# Patient Record
Sex: Male | Born: 1945 | Hispanic: No | Smoking: Former smoker
Health system: Southern US, Community
[De-identification: ages and names within clinical notes are randomized; demographics above are authoritative.]

## PROBLEM LIST (undated history)

## (undated) DIAGNOSIS — I495 Sick sinus syndrome: Secondary | ICD-10-CM

## (undated) DIAGNOSIS — G4733 Obstructive sleep apnea (adult) (pediatric): Secondary | ICD-10-CM

## (undated) DIAGNOSIS — I10 Essential (primary) hypertension: Secondary | ICD-10-CM

## (undated) DIAGNOSIS — C801 Malignant (primary) neoplasm, unspecified: Secondary | ICD-10-CM

## (undated) DIAGNOSIS — I519 Heart disease, unspecified: Secondary | ICD-10-CM

## (undated) DIAGNOSIS — U071 COVID-19: Secondary | ICD-10-CM

## (undated) DIAGNOSIS — K769 Liver disease, unspecified: Secondary | ICD-10-CM

## (undated) DIAGNOSIS — J449 Chronic obstructive pulmonary disease, unspecified: Secondary | ICD-10-CM

## (undated) DIAGNOSIS — N200 Calculus of kidney: Secondary | ICD-10-CM

## (undated) DIAGNOSIS — M2550 Pain in unspecified joint: Secondary | ICD-10-CM

## (undated) HISTORY — DX: Obstructive sleep apnea (adult) (pediatric): G47.33

## (undated) HISTORY — DX: Liver disease, unspecified: K76.9

## (undated) HISTORY — DX: Heart disease, unspecified: I51.9

## (undated) HISTORY — DX: Calculus of kidney: N20.0

## (undated) HISTORY — DX: Pain in unspecified joint: M25.50

## (undated) HISTORY — DX: Sick sinus syndrome: I49.5

## (undated) HISTORY — DX: Chronic obstructive pulmonary disease, unspecified: J44.9

## (undated) HISTORY — DX: Essential (primary) hypertension: I10

## (undated) HISTORY — PX: TRANSURETHRAL RESECTION OF PROSTATE: SHX73

---

## 2009-07-18 ENCOUNTER — Encounter: Admission: RE | Admit: 2009-07-18 | Discharge: 2009-07-18 | Payer: Self-pay | Admitting: General Surgery

## 2009-07-22 ENCOUNTER — Ambulatory Visit (HOSPITAL_COMMUNITY): Admission: RE | Admit: 2009-07-22 | Discharge: 2009-07-22 | Payer: Self-pay | Admitting: Gastroenterology

## 2009-11-17 ENCOUNTER — Encounter: Admission: RE | Admit: 2009-11-17 | Discharge: 2009-11-17 | Payer: Self-pay | Admitting: General Surgery

## 2009-11-25 ENCOUNTER — Emergency Department (HOSPITAL_BASED_OUTPATIENT_CLINIC_OR_DEPARTMENT_OTHER): Admission: EM | Admit: 2009-11-25 | Discharge: 2009-11-26 | Payer: Self-pay | Admitting: Emergency Medicine

## 2009-11-26 ENCOUNTER — Ambulatory Visit: Payer: Self-pay | Admitting: Diagnostic Radiology

## 2010-06-18 LAB — URINALYSIS, ROUTINE W REFLEX MICROSCOPIC
Bilirubin Urine: NEGATIVE
Glucose, UA: NEGATIVE mg/dL
Ketones, ur: NEGATIVE mg/dL
Nitrite: POSITIVE — AB
Urobilinogen, UA: 0.2 mg/dL (ref 0.0–1.0)

## 2010-06-18 LAB — URINE MICROSCOPIC-ADD ON

## 2010-06-18 LAB — CBC
Hemoglobin: 14.4 g/dL (ref 13.0–17.0)
MCHC: 33.9 g/dL (ref 30.0–36.0)
MCV: 91.5 fL (ref 78.0–100.0)
Platelets: 170 10*3/uL (ref 150–400)
RDW: 11.8 % (ref 11.5–15.5)
WBC: 4.9 10*3/uL (ref 4.0–10.5)

## 2010-06-18 LAB — DIFFERENTIAL
Eosinophils Absolute: 0.3 10*3/uL (ref 0.0–0.7)
Eosinophils Relative: 7 % — ABNORMAL HIGH (ref 0–5)
Lymphocytes Relative: 40 % (ref 12–46)
Lymphs Abs: 1.9 10*3/uL (ref 0.7–4.0)
Monocytes Absolute: 0.5 10*3/uL (ref 0.1–1.0)
Neutro Abs: 2.1 10*3/uL (ref 1.7–7.7)

## 2010-06-18 LAB — COMPREHENSIVE METABOLIC PANEL
ALT: 35 U/L (ref 0–53)
Albumin: 3.8 g/dL (ref 3.5–5.2)
BUN: 9 mg/dL (ref 6–23)
Creatinine, Ser: 0.7 mg/dL (ref 0.4–1.5)
GFR calc Af Amer: >60 mL/min (ref >60)
Potassium: 3.8 mEq/L (ref 3.5–5.1)
Sodium: 140 mEq/L (ref 135–145)

## 2010-06-18 LAB — GLUCOSE, CAPILLARY: Glucose-Capillary: 151 mg/dL — ABNORMAL HIGH (ref 70–99)

## 2010-06-18 LAB — POCT CARDIAC MARKERS
CKMB, poc: <1 ng/mL — ABNORMAL LOW (ref 1.0–8.0)
Myoglobin, poc: 54.7 ng/mL (ref 12–200)

## 2010-10-28 ENCOUNTER — Ambulatory Visit (INDEPENDENT_AMBULATORY_CARE_PROVIDER_SITE_OTHER): Payer: Medicaid Other | Admitting: General Surgery

## 2010-10-28 ENCOUNTER — Encounter (INDEPENDENT_AMBULATORY_CARE_PROVIDER_SITE_OTHER): Payer: Self-pay | Admitting: General Surgery

## 2010-10-28 VITALS — Temp 97.8°F

## 2010-10-28 DIAGNOSIS — R1900 Intra-abdominal and pelvic swelling, mass and lump, unspecified site: Secondary | ICD-10-CM | POA: Insufficient documentation

## 2010-10-28 LAB — COMPREHENSIVE METABOLIC PANEL
ALT: 15 U/L (ref 0–53)
AST: 20 U/L (ref 0–37)
Albumin: 4.2 g/dL (ref 3.5–5.2)
Calcium: 9.3 mg/dL (ref 8.4–10.5)
Creat: 0.71 mg/dL (ref 0.50–1.35)
Potassium: 3.9 mEq/L (ref 3.5–5.3)
Total Bilirubin: 0.5 mg/dL (ref 0.3–1.2)
Total Protein: 6.9 g/dL (ref 6.0–8.3)

## 2010-10-28 LAB — CBC WITH DIFFERENTIAL/PLATELET
HCT: 41.7 % (ref 39.0–52.0)
Lymphs Abs: 1.8 10*3/uL (ref 0.7–4.0)
MCHC: 32.6 g/dL (ref 30.0–36.0)
Monocytes Absolute: 0.4 10*3/uL (ref 0.1–1.0)
Monocytes Relative: 8 % (ref 3–12)
Neutro Abs: 1.8 10*3/uL (ref 1.7–7.7)
Neutrophils Relative %: 43 % (ref 43–77)
WBC: 4.2 10*3/uL (ref 4.0–10.5)

## 2010-10-28 NOTE — Assessment & Plan Note (Signed)
Get repeat CMET, CBC, tumor markers. Get repeat MRI to evaluate for change.   Pt was supposed to get MRI 9 months ago, but did not follow up.   Will schedule.

## 2010-10-28 NOTE — Progress Notes (Signed)
Theodore Hess is a 65 y.o. male.    Chief Complaint  Patient presents with  . 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  Last year he was occasionally having epigastric pain, but that is resolved.  He has had no weight loss, no nausea or vomiting.  He denies fevers/ chills.  He has not been having any other medical problems recently other than prostate issues.    Past Medical History  Diagnosis Date  . Hypertension   . Heart disease   . Liver disease   . Kidney stone   . Hearing loss   . Joint pain   . Heart disease   . Liver disease   . Kidney stone   . Sinus pain   . Pain in elbow joint     Past Surgical History  Procedure Date  . Prostate biopsy     History reviewed. No pertinent family history.  Social History History  Substance Use Topics  . Smoking status: Never Smoker   . Smokeless tobacco: Not on file  . Alcohol Use: 0.6 oz/week    1 Glasses of wine per week    No Known Allergies  Current Outpatient Prescriptions  Medication Sig Dispense Refill  . aspirin 81 MG tablet Take 81 mg by mouth daily.        . Budesonide-Formoterol Fumarate (SYMBICORT IN) Inhale into the lungs.        Marland Kitchen desonide (DESOWEN) 0.05 % cream Apply 1 application topically 2 (two) times daily.        . Diclofenac Sodium (SOLARAZE) 3 % GEL Place onto the skin.        Marland Kitchen dutasteride (AVODART) 0.5 MG capsule Take 0.5 mg by mouth daily.        Marland Kitchen atenolol (TENORMIN) 100 MG tablet Take by mouth daily.        Marland Kitchen desonide (DESONATE) 0.05 % gel Apply topically 2 (two) times daily.        Marland Kitchen loratadine (CLARITIN) 10 MG tablet Take 10 mg by mouth daily.        . promethazine (PHENERGAN) 12.5 MG tablet Take 12.5 mg by mouth every 6 (six) hours as needed.        . Silodosin (RAPAFLO PO) Take by mouth.          Review of Systems Review of Systems  Constitutional: Negative.   HENT: Positive for congestion.   Eyes: Negative.     Respiratory: Positive for cough and shortness of breath.   Cardiovascular: Negative.   Gastrointestinal: Negative.   Genitourinary:       Difficulty urinating for large prostate  Musculoskeletal: Positive for joint pain.  Skin: Negative.   Neurological: Negative.   Endo/Heme/Allergies: Negative.   Psychiatric/Behavioral: Negative.     Physical Exam Physical Exam  Constitutional: He is oriented to person, place, and time. He appears well-developed and well-nourished. No distress.  HENT:  Head: Normocephalic and atraumatic.  Mouth/Throat: No oropharyngeal exudate.  Eyes: Conjunctivae are normal. Pupils are equal, round, and reactive to light. No scleral icterus.  Neck: Normal range of motion. Neck supple. No tracheal deviation present. No thyromegaly present.  Cardiovascular: Normal rate and regular rhythm.   Respiratory: Effort normal. No respiratory distress. He exhibits no tenderness.  GI: Soft. Bowel sounds are normal. He exhibits no distension and no mass. There is no  tenderness. There is no rebound and no guarding.  Musculoskeletal: Normal range of motion. He exhibits no edema and no tenderness.  Lymphadenopathy:    He has no cervical adenopathy.  Neurological: He is alert and oriented to person, place, and time. Coordination normal.  Skin: Skin is warm and dry. No rash noted. He is not diaphoretic. No erythema. No pallor.  Psychiatric: He has a normal mood and affect. His behavior is normal. Judgment and thought content normal.     Temperature 97.8 F (36.6 C).  Assessment/Plan  Mass of abdomen, porta hepatis Get repeat CMET, CBC, tumor markers. Get repeat MRI to evaluate for change.   Pt was supposed to get MRI 9 months ago, but did not follow up.   Will schedule.      Earlie Arciga 10/28/2010, 3:33 PM

## 2010-11-06 ENCOUNTER — Ambulatory Visit
Admission: RE | Admit: 2010-11-06 | Discharge: 2010-11-06 | Disposition: A | Discharge status: Home / Self Care | Payer: Self-pay | Source: Ambulatory Visit | Attending: General Surgery | Admitting: General Surgery

## 2010-11-06 DIAGNOSIS — R1900 Intra-abdominal and pelvic swelling, mass and lump, unspecified site: Secondary | ICD-10-CM

## 2010-11-06 MED ORDER — GADOBENATE DIMEGLUMINE 529 MG/ML IV SOLN
15.0000 mL | Freq: Once | INTRAVENOUS | Status: AC | PRN
  Administered 2010-11-06: 15 mL via INTRAVENOUS

## 2011-02-16 ENCOUNTER — Other Ambulatory Visit (INDEPENDENT_AMBULATORY_CARE_PROVIDER_SITE_OTHER): Payer: Self-pay | Admitting: General Surgery

## 2011-02-16 DIAGNOSIS — R160 Hepatomegaly, not elsewhere classified: Secondary | ICD-10-CM

## 2011-02-16 DIAGNOSIS — K8689 Other specified diseases of pancreas: Secondary | ICD-10-CM

## 2011-02-23 ENCOUNTER — Ambulatory Visit
Admission: RE | Admit: 2011-02-23 | Discharge: 2011-02-23 | Disposition: A | Discharge status: Home / Self Care | Payer: Medicare Other | Source: Ambulatory Visit | Attending: General Surgery | Admitting: General Surgery

## 2011-02-23 DIAGNOSIS — K8689 Other specified diseases of pancreas: Secondary | ICD-10-CM

## 2011-02-23 DIAGNOSIS — R160 Hepatomegaly, not elsewhere classified: Secondary | ICD-10-CM

## 2011-02-25 NOTE — Progress Notes (Signed)
Quick Note:  MRI unchanged. Would not order follow up scan at this point since portal lymph node is stable. ______

## 2011-03-21 ENCOUNTER — Encounter (INDEPENDENT_AMBULATORY_CARE_PROVIDER_SITE_OTHER): Payer: Self-pay

## 2011-03-22 ENCOUNTER — Ambulatory Visit (INDEPENDENT_AMBULATORY_CARE_PROVIDER_SITE_OTHER): Payer: Medicare Other | Admitting: General Surgery

## 2011-03-22 ENCOUNTER — Encounter (INDEPENDENT_AMBULATORY_CARE_PROVIDER_SITE_OTHER): Payer: Self-pay | Admitting: General Surgery

## 2011-03-22 VITALS — BP 122/84 | HR 64 | Temp 97.9°F | Resp 18 | Ht 66.75 in | Wt 149.6 lb

## 2011-03-22 DIAGNOSIS — R1900 Intra-abdominal and pelvic swelling, mass and lump, unspecified site: Secondary | ICD-10-CM

## 2011-03-22 NOTE — Progress Notes (Signed)
HISTORY: Pt doing well.  He denies pain.  He had around a five pound weight loss over the summer, but he has regained this.  He has had no blood per rectum and is up to date on his colonoscopy.  He has gone off his antihypertensive at the instruction of his cardiologist.  He has no other change in his health status.     PERTINENT REVIEW OF SYSTEMS: Otherwise negative.     EXAM: Head: Normocephalic and atraumatic.  Eyes:  Conjunctivae are normal. Pupils are equal, round, and reactive to light. No scleral icterus.  Neck:  Normal range of motion. Neck supple. No tracheal deviation present. No thyromegaly present.  Resp: No respiratory distress, normal effort. Abd:  Abdomen is soft, non distended and non tender. No masses are palpable.  There is no rebound and no guarding.  Neurological: Alert and oriented to person, place, and time. Coordination normal.  Skin: Skin is warm and dry. No rash noted. No diaphoretic. No erythema. No pallor.  Psychiatric: Normal mood and affect. Normal behavior. Judgment and thought content normal.     ASSESSMENT AND PLAN:   Mass of abdomen, porta hepatis Portal node same as last MR in the spring, and smaller than original MR.   Tumor markers normal Follow up in 1 year. Think enlarged node is a result of remote history of liver trauma    Maudry Diego, MD Surgical Oncology, General & Endocrine Surgery [facility]  Ron Parker, MD, MD Ron Parker, MD

## 2011-03-22 NOTE — Assessment & Plan Note (Signed)
Portal node same as last MR in the spring, and smaller than original MR.   Tumor markers normal Follow up in 1 year. Think enlarged node is a result of remote history of liver trauma

## 2011-03-22 NOTE — Patient Instructions (Signed)
Follow up in 1 year.  Let us know if dramatic change in health status.

## 2011-09-22 ENCOUNTER — Emergency Department (HOSPITAL_BASED_OUTPATIENT_CLINIC_OR_DEPARTMENT_OTHER)
Admission: EM | Admit: 2011-09-22 | Discharge: 2011-09-22 | Disposition: A | Discharge status: Home / Self Care | Payer: Medicare (Managed Care) | Attending: Emergency Medicine | Admitting: Emergency Medicine

## 2011-09-22 ENCOUNTER — Emergency Department (HOSPITAL_BASED_OUTPATIENT_CLINIC_OR_DEPARTMENT_OTHER): Payer: Medicare (Managed Care)

## 2011-09-22 ENCOUNTER — Encounter (HOSPITAL_BASED_OUTPATIENT_CLINIC_OR_DEPARTMENT_OTHER): Payer: Self-pay | Admitting: Family Medicine

## 2011-09-22 DIAGNOSIS — M549 Dorsalgia, unspecified: Secondary | ICD-10-CM | POA: Insufficient documentation

## 2011-09-22 DIAGNOSIS — I10 Essential (primary) hypertension: Secondary | ICD-10-CM | POA: Insufficient documentation

## 2011-09-22 DIAGNOSIS — M25519 Pain in unspecified shoulder: Secondary | ICD-10-CM

## 2011-09-22 DIAGNOSIS — R079 Chest pain, unspecified: Secondary | ICD-10-CM | POA: Insufficient documentation

## 2011-09-22 LAB — DIFFERENTIAL
Basophils Absolute: 0 10*3/uL (ref 0.0–0.1)
Basophils Relative: 0 % (ref 0–1)
Eosinophils Absolute: 0.1 10*3/uL (ref 0.0–0.7)
Eosinophils Relative: 4 % (ref 0–5)
Monocytes Absolute: 0.4 10*3/uL (ref 0.1–1.0)
Monocytes Relative: 10 % (ref 3–12)
Neutro Abs: 1.8 10*3/uL (ref 1.7–7.7)

## 2011-09-22 LAB — CBC
HCT: 40.3 % (ref 39.0–52.0)
Hemoglobin: 14.1 g/dL (ref 13.0–17.0)
MCH: 31.3 pg (ref 26.0–34.0)
MCHC: 35 g/dL (ref 30.0–36.0)
MCV: 89.4 fL (ref 78.0–100.0)
RDW: 11.2 % — ABNORMAL LOW (ref 11.5–15.5)

## 2011-09-22 LAB — TROPONIN I: Troponin I: <0.3 ng/mL (ref <0.30)

## 2011-09-22 LAB — COMPREHENSIVE METABOLIC PANEL
AST: 19 U/L (ref 0–37)
Albumin: 3.7 g/dL (ref 3.5–5.2)
BUN: 8 mg/dL (ref 6–23)
Calcium: 9 mg/dL (ref 8.4–10.5)
Creatinine, Ser: 0.7 mg/dL (ref 0.50–1.35)
Total Bilirubin: 0.5 mg/dL (ref 0.3–1.2)
Total Protein: 7.2 g/dL (ref 6.0–8.3)

## 2011-09-22 MED ORDER — KETOROLAC TROMETHAMINE 60 MG/2ML IM SOLN
60.0000 mg | Freq: Once | INTRAMUSCULAR | Status: AC
  Administered 2011-09-22: 60 mg via INTRAMUSCULAR
  Filled 2011-09-22: qty 2

## 2011-09-22 MED ORDER — HYDROCODONE-ACETAMINOPHEN 5-500 MG PO TABS: 1.0000 | ORAL_TABLET | Freq: Four times a day (QID) | ORAL | Status: AC | PRN

## 2011-09-22 NOTE — ED Notes (Signed)
Pt c/o right shoulder, upper back and right upper chest pain after lifting buckets of water and push mowing grass 4-5 days ago. Pain is worse with movement and cough. Pt has been coughing x 3 days.

## 2011-09-22 NOTE — ED Provider Notes (Signed)
History     CSN: [id number]  Arrival date & time 09/22/11  1132   First MD Initiated Contact with Patient 09/22/11 1205      Chief Complaint  Patient presents with  . Shoulder Pain  . Back Pain  . Chest Pain    (Consider location/radiation/quality/duration/timing/severity/associated sxs/prior treatment) HPI Comments: Pain in the right shoulder/upper back for the past 5 days.  Seemed to start after helping mow the grass, lifting buckets of water.    Patient speaks only mandarin chinese, history taken from son who acts as Nurse, learning disability.  Patient is a 66 y.o. male presenting with shoulder pain. The history is provided by the patient.  Shoulder Pain This is a new problem. Episode onset: 5 days ago. The problem occurs constantly. The problem has not changed since onset.The symptoms are aggravated by coughing (movement). Nothing relieves the symptoms. Treatments tried: nsaids. The treatment provided mild relief.    Past Medical History  Diagnosis Date  . Hypertension   . Heart disease   . Liver disease   . Kidney stone   . Hearing loss   . Joint pain   . Heart disease   . Liver disease   . Kidney stone   . Sinus pain   . Pain in elbow joint     Past Surgical History  Procedure Date  . Prostate biopsy     No family history on file.  History  Substance Use Topics  . Smoking status: Never Smoker   . Smokeless tobacco: Never Used  . Alcohol Use: 0.6 oz/week    1 Glasses of wine per week      Review of Systems  All other systems reviewed and are negative.    Allergies  Review of patient's allergies indicates no known allergies.  Home Medications   Current Outpatient Rx  Name Route Sig Dispense Refill  . AMMONIUM LACTATE 12 % EX LOTN Topical Apply 1 application topically as needed.      . ASPIRIN 81 MG PO TABS Oral Take 81 mg by mouth daily.      . ATENOLOL 100 MG PO TABS Oral Take by mouth daily.      [place] IN Inhalation Inhale into the lungs.      .  DESONIDE 0.05 % EX GEL Topical Apply topically 2 (two) times daily.      . DESONIDE 0.05 % EX CREA Topical Apply 1 application topically 2 (two) times daily.      Marland Kitchen DICLOFENAC SODIUM 3 % TD GEL Transdermal Place onto the skin.      Marland Kitchen DUTASTERIDE 0.5 MG PO CAPS Oral Take 0.5 mg by mouth daily.      Marland Kitchen FLUTICASONE PROPIONATE 50 MCG/ACT NA SUSP Nasal Place 2 sprays into the nose daily.      Marland Kitchen LORATADINE 10 MG PO TABS Oral Take 10 mg by mouth daily.      Marland Kitchen MELATONIN 3 MG PO CAPS Oral Take 3 mg by mouth daily.      Marland Kitchen PROMETHAZINE HCL 12.5 MG PO TABS Oral Take 12.5 mg by mouth every 6 (six) hours as needed.      Marland Kitchen RAPAFLO PO Oral Take by mouth.      . ZOLPIDEM TARTRATE 10 MG PO TABS Oral Take 10 mg by mouth at bedtime as needed.        BP 131/93  Pulse 56  Temp 97.9 F (36.6 C) (Oral)  Resp 16  SpO2 99%  Physical Exam  Nursing note and vitals reviewed. Constitutional: He is oriented to person, place, and time. He appears well-developed and well-nourished. No distress.  HENT:  Head: Normocephalic and atraumatic.  Neck: Normal range of motion. Neck supple.  Cardiovascular: Normal rate and regular rhythm.   No murmur heard. Pulmonary/Chest: Effort normal and breath sounds normal. No respiratory distress. He has no wheezes.  Abdominal: Soft. Bowel sounds are normal. He exhibits no distension. There is no tenderness.  Musculoskeletal: Normal range of motion. He exhibits no edema.       There is mild ttp in the right upper back/shoulder area.  The rue is otherwise neurovascularly intact.  Neurological: He is alert and oriented to person, place, and time.  Skin: Skin is warm and dry. He is not diaphoretic.    ED Course  Procedures (including critical care time)  Labs Reviewed - No data to display No results found.   No diagnosis found.   Date: 09/22/2011  Rate: 53  Rhythm: sinus bradycardia  QRS Axis: normal  Intervals: normal  ST/T Wave abnormalities: normal  Conduction  Disutrbances:first-degree A-V block   Narrative Interpretation:   Old EKG Reviewed: none available    MDM  The symptoms do no sound cardiac to me.  The workup is negative despite 5 days of atypical pain.  He will be discharged to home with ibu, rest, lortab.  To return prn if worsens.        Geoffery Lyons, MD 09/22/11 [phone]

## 2011-09-22 NOTE — Discharge Instructions (Signed)
Ibuprofen 600 mg three times daily for the next 5 days.  Hydrocodone as needed for pain not relieved with ibuprofen.  Shoulder Pain The shoulder is a ball and socket joint. The muscles and tendons (rotator cuff) are what keep the shoulder in its joint and stable. This collection of muscles and tendons holds in the head (ball) of the humerus (upper arm bone) in the fossa (cup) of the scapula (shoulder blade). Today no reason was found for your shoulder pain. Often pain in the shoulder may be treated conservatively with temporary immobilization. For example, holding the shoulder in one place using a sling for rest. Physical therapy may be needed if problems continue. HOME CARE INSTRUCTIONS   Apply ice to the sore area for 15 to 20 minutes, 3 to 4 times per day for the first 2 days. Put the ice in a plastic bag. Place a towel between the bag of ice and your skin.   If you have or were given a shoulder sling and straps, do not remove for as long as directed by your caregiver or until you see a caregiver for a follow-up examination. If you need to remove it to shower or bathe, move your arm as little as possible.   Sleep on several pillows at night to lessen swelling and pain.   Only take over-the-counter or prescription medicines for pain, discomfort, or fever as directed by your caregiver.   Keep any follow-up appointments in order to avoid any type of permanent shoulder disability or chronic pain problems.  SEEK MEDICAL CARE IF:   Pain in your shoulder increases or new pain develops in your arm, hand, or fingers.   Your hand or fingers are colder than your other hand.   You do not obtain pain relief with the medications or your pain becomes worse.  SEEK IMMEDIATE MEDICAL CARE IF:   Your arm, hand, or fingers are numb or tingling.   Your arm, hand, or fingers are swollen, painful, or turn white or blue.   You develop chest pain or shortness of breath.  MAKE SURE YOU:   Understand these  instructions.   Will watch your condition.   Will get help right away if you are not doing well or get worse.  Document Released: 12/29/2004 Document Revised: 03/10/2011 Document Reviewed: 03/05/2011 [facility] Patient Information 2012 [place], [place].

## 2012-04-20 ENCOUNTER — Other Ambulatory Visit (INDEPENDENT_AMBULATORY_CARE_PROVIDER_SITE_OTHER): Payer: Self-pay | Admitting: General Surgery

## 2012-04-20 ENCOUNTER — Encounter (INDEPENDENT_AMBULATORY_CARE_PROVIDER_SITE_OTHER): Payer: Self-pay | Admitting: General Surgery

## 2012-04-20 ENCOUNTER — Ambulatory Visit (INDEPENDENT_AMBULATORY_CARE_PROVIDER_SITE_OTHER): Payer: Medicare Other | Admitting: General Surgery

## 2012-04-20 VITALS — BP 110/82 | HR 72 | Temp 97.5°F | Resp 18 | Wt 155.0 lb

## 2012-04-20 DIAGNOSIS — R160 Hepatomegaly, not elsewhere classified: Secondary | ICD-10-CM

## 2012-04-20 DIAGNOSIS — K769 Liver disease, unspecified: Secondary | ICD-10-CM

## 2012-04-20 NOTE — Patient Instructions (Signed)
Will get MRI.  If this is same or better, will not get repeat scan or make you come back.

## 2012-04-20 NOTE — Progress Notes (Signed)
Subjective:     Patient ID: Theodore Hess, male   DOB: 02-23-1946, 67 y.o.   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  He continues to have no significant abdominal complaints. He denies jaundice or dysuria or acholic stools. He denies abdominal pain, diarrhea, or constipation. He denies weight loss. He has not started any new medications. He does frequently have a cough in the winter time, and his primary care physician has suggested that he think this is allergies.  Review of Systems  All other systems reviewed and are negative.       Objective:   Physical Exam  Constitutional: He is oriented to person, place, and time. He appears well-developed and well-nourished. No distress.  HENT:  Head: Normocephalic and atraumatic.  Right Ear: External ear normal.  Eyes: Conjunctivae normal are normal. Pupils are equal, round, and reactive to light. No scleral icterus.  Neck: Normal range of motion. Neck supple. No tracheal deviation present. No thyromegaly present.  Cardiovascular: Normal rate, regular rhythm and intact distal pulses.   Pulmonary/Chest: Effort normal and breath sounds normal. No respiratory distress.  Abdominal: Soft. He exhibits no distension and no mass. There is no tenderness. There is no rebound and no guarding.  Musculoskeletal: Normal range of motion. He exhibits no edema and no tenderness.  Lymphadenopathy:    He has no cervical adenopathy.  Neurological: He is alert and oriented to person, place, and time. Coordination normal.  Skin: Skin is warm and dry. No rash noted. He is not diaphoretic. No erythema. No pallor.  Psychiatric: He has a normal mood and affect. His behavior is normal.       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 If the scan is unchanged, I will not schedule followup imaging or followup appointments.

## 2012-04-26 ENCOUNTER — Telehealth (INDEPENDENT_AMBULATORY_CARE_PROVIDER_SITE_OTHER): Payer: Self-pay

## 2012-04-26 ENCOUNTER — Ambulatory Visit
Admission: RE | Admit: 2012-04-26 | Discharge: 2012-04-26 | Disposition: A | Discharge status: Home / Self Care | Payer: Medicare Other | Source: Ambulatory Visit | Attending: General Surgery | Admitting: General Surgery

## 2012-04-26 DIAGNOSIS — R160 Hepatomegaly, not elsewhere classified: Secondary | ICD-10-CM

## 2012-04-26 MED ORDER — GADOBENATE DIMEGLUMINE 529 MG/ML IV SOLN
14.0000 mL | Freq: Once | INTRAVENOUS | Status: AC | PRN
  Administered 2012-04-26: 14 mL via INTRAVENOUS

## 2012-04-26 NOTE — Telephone Encounter (Signed)
Pt's son made aware MRI shows no changes.  Pt will f/u prn.

## 2013-01-08 ENCOUNTER — Other Ambulatory Visit (HOSPITAL_BASED_OUTPATIENT_CLINIC_OR_DEPARTMENT_OTHER): Payer: Self-pay | Admitting: Internal Medicine

## 2013-01-08 DIAGNOSIS — R05 Cough: Secondary | ICD-10-CM

## 2013-01-08 DIAGNOSIS — R053 Chronic cough: Secondary | ICD-10-CM

## 2013-01-09 ENCOUNTER — Ambulatory Visit (HOSPITAL_BASED_OUTPATIENT_CLINIC_OR_DEPARTMENT_OTHER): Payer: Medicare Other

## 2013-01-10 ENCOUNTER — Encounter (HOSPITAL_BASED_OUTPATIENT_CLINIC_OR_DEPARTMENT_OTHER): Payer: Self-pay

## 2013-01-10 ENCOUNTER — Other Ambulatory Visit (HOSPITAL_BASED_OUTPATIENT_CLINIC_OR_DEPARTMENT_OTHER): Payer: Self-pay | Admitting: Internal Medicine

## 2013-01-10 ENCOUNTER — Ambulatory Visit (HOSPITAL_BASED_OUTPATIENT_CLINIC_OR_DEPARTMENT_OTHER)
Admission: RE | Admit: 2013-01-10 | Discharge: 2013-01-10 | Disposition: A | Discharge status: Home / Self Care | Payer: Medicare Other | Source: Ambulatory Visit | Attending: Internal Medicine | Admitting: Internal Medicine

## 2013-01-10 DIAGNOSIS — R05 Cough: Secondary | ICD-10-CM

## 2013-01-10 DIAGNOSIS — I700 Atherosclerosis of aorta: Secondary | ICD-10-CM | POA: Insufficient documentation

## 2013-01-10 DIAGNOSIS — I77810 Thoracic aortic ectasia: Secondary | ICD-10-CM | POA: Insufficient documentation

## 2013-01-10 DIAGNOSIS — R059 Cough, unspecified: Secondary | ICD-10-CM | POA: Insufficient documentation

## 2013-01-10 DIAGNOSIS — I2510 Atherosclerotic heart disease of native coronary artery without angina pectoris: Secondary | ICD-10-CM | POA: Insufficient documentation

## 2013-01-10 DIAGNOSIS — R911 Solitary pulmonary nodule: Secondary | ICD-10-CM | POA: Insufficient documentation

## 2013-01-10 MED ORDER — IOHEXOL 300 MG/ML  SOLN
80.0000 mL | Freq: Once | INTRAMUSCULAR | Status: AC | PRN
  Administered 2013-01-10: 80 mL via INTRAVENOUS

## 2013-02-04 ENCOUNTER — Ambulatory Visit: Payer: Medicare Other | Admitting: Cardiology

## 2013-02-17 ENCOUNTER — Encounter: Payer: Self-pay | Admitting: *Deleted

## 2013-02-17 ENCOUNTER — Encounter: Payer: Self-pay | Admitting: Cardiology

## 2013-02-17 DIAGNOSIS — N200 Calculus of kidney: Secondary | ICD-10-CM | POA: Insufficient documentation

## 2013-02-17 DIAGNOSIS — I519 Heart disease, unspecified: Secondary | ICD-10-CM | POA: Insufficient documentation

## 2013-02-17 DIAGNOSIS — M2550 Pain in unspecified joint: Secondary | ICD-10-CM | POA: Insufficient documentation

## 2013-02-17 DIAGNOSIS — K769 Liver disease, unspecified: Secondary | ICD-10-CM | POA: Insufficient documentation

## 2013-02-17 DIAGNOSIS — I10 Essential (primary) hypertension: Secondary | ICD-10-CM | POA: Insufficient documentation

## 2013-02-19 ENCOUNTER — Encounter: Payer: Self-pay | Admitting: Cardiology

## 2013-02-19 ENCOUNTER — Ambulatory Visit: Payer: Medicare Other | Admitting: Cardiology

## 2013-02-19 ENCOUNTER — Ambulatory Visit (INDEPENDENT_AMBULATORY_CARE_PROVIDER_SITE_OTHER): Payer: Medicare Other | Admitting: Cardiology

## 2013-02-19 VITALS — BP 118/80 | HR 63 | Ht 66.0 in | Wt 159.0 lb

## 2013-02-19 DIAGNOSIS — I495 Sick sinus syndrome: Secondary | ICD-10-CM | POA: Insufficient documentation

## 2013-02-19 DIAGNOSIS — I10 Essential (primary) hypertension: Secondary | ICD-10-CM

## 2013-02-19 DIAGNOSIS — G4733 Obstructive sleep apnea (adult) (pediatric): Secondary | ICD-10-CM

## 2013-02-19 HISTORY — DX: Obstructive sleep apnea (adult) (pediatric): G47.33

## 2013-02-19 HISTORY — DX: Sick sinus syndrome: I49.5

## 2013-02-19 NOTE — Progress Notes (Signed)
      [address] [place], [place]  [address] Phone: [phone] Fax:  [phone]  Date:  02/19/2013   ID:  Theodore Hess, Theodore Hess 01-Feb-1946, MRN [id number]  PCP:  Ron Parker, MD   History of Present Illness: Theodore Hess is a 67 y.o. male with hypertension , PCP Dr. Lovell Sheehan. Prior history of COPD, TURP, seasonal allergies. He quit tobacco approximately 20 years ago in 1989. He drinks one to 2 beers a day. His mother died at age 67.  He had a cardiologist in [place]. No prior MI. Gets nervous and has palpitations at times. Walking up stairs can have SOB occasionally but not severe. His prior stress test in 2010 was low risk with no ischemia. His blood pressure still remains slightly low. He is not on any blood pressure medications currently. Since being off of the atenolol, he's not had any further bradycardia. No longer has first degree AV block. No syncope. Doing well.   Wt Readings from Last 3 Encounters:  04/20/12 155 lb (70.308 kg)  03/22/11 149 lb 9.6 oz (67.858 kg)     Past Medical History  Diagnosis Date  . Hypertension   . Heart disease   . Liver disease   . Kidney stone   . Hearing loss   . Joint pain   . COPD (chronic obstructive pulmonary disease)   . Sinus node dysfunction   . Sinoatrial node dysfunction 02/19/2013    Off atenolol 2013    Past Surgical History  Procedure Laterality Date  . Transurethral resection of prostate      Current Outpatient Prescriptions  Medication Sig Dispense Refill  . aspirin 81 MG tablet Take 81 mg by mouth daily.        Marland Kitchen dutasteride (AVODART) 0.5 MG capsule Take 0.5 mg by mouth daily.        . fluticasone (FLONASE) 50 MCG/ACT nasal spray Place 2 sprays into the nose daily.        . Silodosin (RAPAFLO PO) Take by mouth.         No current facility-administered medications for this visit.    Allergies:   No Known Allergies  Social History:  The patient  reports that he has never smoked. He has never  used smokeless tobacco. He reports that he drinks about 0.6 ounces of alcohol per week. He reports that he does not use illicit drugs.   ROS:  Please see the history of present illness.   Denies syncope, bleeding,, PND   PHYSICAL EXAM: VS:  There were no vitals taken for this visit. Well nourished, well developed, in no acute distress HEENT: normal Neck: no JVD Cardiac:  normal S1, S2; RRR; no murmur Lungs:  clear to auscultation bilaterally, no wheezing, rhonchi or rales Abd: soft, nontender, no hepatomegaly Ext: no edema Skin: warm and dry Neuro: no focal abnormalities noted  EKG:  10/12-sinus bradycardia 53 with first degree AV block.      11/13-normal sinus rhythm-no longer first degree AV block  ASSESSMENT AND PLAN:  1. Sinoatrial node dysfunction-currently doing well off of atenolol. No longer had first degree AV block from previous EKG. Reassuring. No high-risk symptoms such as syncope. 2. Hypertension-currently well controlled. No changes made.ARB.  3. I will see back on as-needed basis  Signed, Donato Schultz, MD [facility]  02/19/2013 9:54 AM

## 2013-02-19 NOTE — Patient Instructions (Signed)
Your physician recommends that you continue on your current medications as directed. Please refer to the Current Medication list given to you today.  Follow up as needed  

## 2018-01-13 ENCOUNTER — Encounter (HOSPITAL_BASED_OUTPATIENT_CLINIC_OR_DEPARTMENT_OTHER): Payer: Self-pay

## 2018-01-13 ENCOUNTER — Emergency Department (HOSPITAL_BASED_OUTPATIENT_CLINIC_OR_DEPARTMENT_OTHER): Payer: Medicare Other

## 2018-01-13 ENCOUNTER — Other Ambulatory Visit: Payer: Self-pay

## 2018-01-13 ENCOUNTER — Emergency Department (HOSPITAL_BASED_OUTPATIENT_CLINIC_OR_DEPARTMENT_OTHER)
Admission: EM | Admit: 2018-01-13 | Discharge: 2018-01-14 | Disposition: A | Discharge status: Home / Self Care | Payer: Medicare Other | Attending: Emergency Medicine | Admitting: Emergency Medicine

## 2018-01-13 DIAGNOSIS — I10 Essential (primary) hypertension: Secondary | ICD-10-CM | POA: Diagnosis not present

## 2018-01-13 DIAGNOSIS — Z7982 Long term (current) use of aspirin: Secondary | ICD-10-CM | POA: Diagnosis not present

## 2018-01-13 DIAGNOSIS — Z79899 Other long term (current) drug therapy: Secondary | ICD-10-CM | POA: Diagnosis not present

## 2018-01-13 DIAGNOSIS — J449 Chronic obstructive pulmonary disease, unspecified: Secondary | ICD-10-CM | POA: Diagnosis not present

## 2018-01-13 DIAGNOSIS — N3001 Acute cystitis with hematuria: Secondary | ICD-10-CM | POA: Insufficient documentation

## 2018-01-13 DIAGNOSIS — R350 Frequency of micturition: Secondary | ICD-10-CM | POA: Insufficient documentation

## 2018-01-13 DIAGNOSIS — Z87891 Personal history of nicotine dependence: Secondary | ICD-10-CM | POA: Diagnosis not present

## 2018-01-13 DIAGNOSIS — N401 Enlarged prostate with lower urinary tract symptoms: Secondary | ICD-10-CM | POA: Insufficient documentation

## 2018-01-13 DIAGNOSIS — R300 Dysuria: Secondary | ICD-10-CM | POA: Diagnosis present

## 2018-01-13 LAB — CBC WITH DIFFERENTIAL/PLATELET
ABS IMMATURE GRANULOCYTES: 0.03 10*3/uL (ref 0.00–0.07)
BASOS PCT: 0 %
Basophils Absolute: 0 10*3/uL (ref 0.0–0.1)
EOS ABS: 0.1 10*3/uL (ref 0.0–0.5)
EOS PCT: 1 %
HCT: 42.7 % (ref 39.0–52.0)
Hemoglobin: 13.9 g/dL (ref 13.0–17.0)
Immature Granulocytes: 1 %
Lymphocytes Relative: 23 %
Lymphs Abs: 1.4 10*3/uL (ref 0.7–4.0)
MCH: 30.3 pg (ref 26.0–34.0)
MCHC: 32.6 g/dL (ref 30.0–36.0)
MCV: 93 fL (ref 80.0–100.0)
MONO ABS: 0.8 10*3/uL (ref 0.1–1.0)
Monocytes Relative: 13 %
NEUTROS ABS: 3.9 10*3/uL (ref 1.7–7.7)
Neutrophils Relative %: 62 %
PLATELETS: 239 10*3/uL (ref 150–400)
RBC: 4.59 MIL/uL (ref 4.22–5.81)
RDW: 11.8 % (ref 11.5–15.5)
WBC: 6.2 10*3/uL (ref 4.0–10.5)
nRBC: 0 % (ref 0.0–0.2)

## 2018-01-13 LAB — URINALYSIS, MICROSCOPIC (REFLEX)

## 2018-01-13 LAB — BASIC METABOLIC PANEL
Anion gap: 8 (ref 5–15)
BUN: 10 mg/dL (ref 8–23)
CO2: 26 mmol/L (ref 22–32)
CREATININE: 0.73 mg/dL (ref 0.61–1.24)
Calcium: 8.9 mg/dL (ref 8.9–10.3)
Chloride: 103 mmol/L (ref 98–111)
GFR calc Af Amer: >60 mL/min (ref >60)
GLUCOSE: 151 mg/dL — AB (ref 70–99)
POTASSIUM: 3.7 mmol/L (ref 3.5–5.1)
SODIUM: 137 mmol/L (ref 135–145)

## 2018-01-13 LAB — URINALYSIS, ROUTINE W REFLEX MICROSCOPIC
BILIRUBIN URINE: NEGATIVE
Glucose, UA: NEGATIVE mg/dL
Ketones, ur: NEGATIVE mg/dL
NITRITE: POSITIVE — AB
PROTEIN: 30 mg/dL — AB
SPECIFIC GRAVITY, URINE: 1.015 (ref 1.005–1.030)
pH: 6.5 (ref 5.0–8.0)

## 2018-01-13 MED ORDER — SODIUM CHLORIDE 0.9 % IV SOLN
2.0000 g | Freq: Once | INTRAVENOUS | Status: AC
  Administered 2018-01-13: 2 g via INTRAVENOUS
  Filled 2018-01-13: qty 20

## 2018-01-13 MED ORDER — SODIUM CHLORIDE 0.9 % IV SOLN
INTRAVENOUS | Status: DC | PRN
  Administered 2018-01-13: 500 mL via INTRAVENOUS

## 2018-01-13 NOTE — ED Triage Notes (Signed)
Pt recently had prostate surgery and today is having groin pain. Denies hematuria or fever.   Pt's native language is [place], but is dialect other than Mandarin. No interpreter available. Family with pt that is able to interpret.

## 2018-01-13 NOTE — ED Provider Notes (Signed)
[facility] EMERGENCY DEPARTMENT Provider Note   CSN: [id number] Arrival date & time: 01/13/18  2127     History   Chief Complaint No chief complaint on file.   HPI Theodore Hess is a 72 y.o. male.  HPI 72 year old male with extensive past medical history as below including history of prostate surgery due to BPH here with dysuria.  The patient states that over the last week, he has had persistently worsening dysuria.  He said associated fullness sensation in his bilateral testes and perineum.  He had a laser ablation of his prostate approximately a month ago and had been recovering well.  Of note, the symptoms started after he had finished antibiotics for a possible urine infection.  He went about a week without symptoms until they returned.  He had associated mild lower abdominal pain.  No nausea or vomiting.  No fevers or chills.  Denies any drainage.  Denies any wounds from his recent surgery.  He did have some mild blood in his urine, though he has had this intermittently since his surgery.  He states [facility] urology.  Past Medical History:  Diagnosis Date  . COPD (chronic obstructive pulmonary disease) ([place])   . Hearing loss   . Heart disease   . Hypertension   . Joint pain   . Kidney stone   . Liver disease   . OSA (obstructive sleep apnea) 02/19/2013   Does not like to wear CPAP  . Sinoatrial node dysfunction ([facility]) 02/19/2013   Off atenolol 2013  . Sinus node dysfunction [facility])     Patient Active Problem List   Diagnosis Date Noted  . Sinoatrial node dysfunction ([place]) 02/19/2013  . OSA (obstructive sleep apnea) 02/19/2013  . Hypertension   . Liver disease   . Kidney stone   . Joint pain   . Mass of abdomen, porta hepatis 10/28/2010    Past Surgical History:  Procedure Laterality Date  . TRANSURETHRAL RESECTION OF PROSTATE          Home Medications    Prior to Admission medications   Medication Sig Start Date End Date Taking? Authorizing  Provider  Albuterol Sulfate (PROAIR HFA IN) Inhale 8.5 mg into the lungs.    [provider]  aspirin 81 MG tablet Take 81 mg by mouth daily.      [provider]  cefpodoxime (VANTIN) 200 MG tablet Take 1 tablet (200 mg total) by mouth 2 (two) times daily for 10 days. 01/14/18 01/24/18  Duffy Bruce, MD  diclofenac sodium (VOLTAREN) 1 % GEL Apply topically 4 (four) times daily.    [provider]  dutasteride (AVODART) 0.5 MG capsule Take 0.5 mg by mouth daily.      [provider]  fluticasone (FLONASE) 50 MCG/ACT nasal spray Place 2 sprays into the nose daily.      [provider]  losartan (COZAAR) 25 MG tablet Take 25 mg by mouth daily.    [provider]  naproxen (NAPROSYN) 375 MG tablet Take 1 tablet (375 mg total) by mouth 2 (two) times daily as needed for up to 10 days for moderate pain. 01/14/18 01/24/18  Duffy Bruce, MD  ondansetron (ZOFRAN ODT) 4 MG disintegrating tablet Take 1 tablet (4 mg total) by mouth every 8 (eight) hours as needed for nausea or vomiting. 01/14/18   Duffy Bruce, MD  Silodosin (RAPAFLO PO) Take by mouth.      [provider]  Tiotropium Bromide Monohydrate (SPIRIVA HANDIHALER  IN) Inhale into the lungs.    [provider]    Family History Family History  Problem Relation Age of Onset  . Stroke Sister     Social History Social History   Tobacco Use  . Smoking status: Former Research scientist (life sciences)  . Smokeless tobacco: Never Used  Substance Use Topics  . Alcohol use: Not Currently    Alcohol/week: 1.0 standard drinks    Types: 1 Glasses of wine per week  . Drug use: No     Allergies   Patient has no known allergies.   Review of Systems Review of Systems  Constitutional: Negative for chills, fatigue and fever.  HENT: Negative for congestion and rhinorrhea.   Eyes: Negative for visual disturbance.  Respiratory: Negative for cough, shortness of breath and wheezing.     Cardiovascular: Negative for chest pain and leg swelling.  Gastrointestinal: Positive for abdominal pain. Negative for diarrhea, nausea and vomiting.  Genitourinary: Positive for dysuria and testicular pain. Negative for flank pain.  Musculoskeletal: Negative for neck pain and neck stiffness.  Skin: Negative for rash and wound.  Allergic/Immunologic: Negative for immunocompromised state.  Neurological: Negative for syncope, weakness and headaches.  All other systems reviewed and are negative.    Physical Exam Updated Vital Signs BP (!) 133/101 (BP Location: Left Arm)   Pulse 69   Temp 98.7 F (37.1 C) (Oral)   Resp 18   Ht 5\' 7"  (1.702 m)   Wt 67.5 kg   SpO2 96%   BMI 23.32 kg/m   Physical Exam  Constitutional: He is oriented to person, place, and time. He appears well-developed and well-nourished. No distress.  HENT:  Head: Normocephalic and atraumatic.  Eyes: Conjunctivae are normal.  Neck: Neck supple.  Cardiovascular: Normal rate, regular rhythm and normal heart sounds. Exam reveals no friction rub.  No murmur heard. Pulmonary/Chest: Effort normal and breath sounds normal. No respiratory distress. He has no wheezes. He has no rales.  Abdominal: Soft. Bowel sounds are normal. He exhibits no distension. There is tenderness (Mild, suprapubic). There is no rebound and no guarding.  Genitourinary:  Genitourinary Comments: There is moderate tenderness of the bilateral testes, which are both descended with normal lie.  Cremasteric reflexes are intact.  Penis without any drainage or erythema.  Moderate suprapubic tenderness.  No perineal tenderness.  No crepitance.  Left hydrocele noted with mild tenderness.  Musculoskeletal: He exhibits no edema.  Neurological: He is alert and oriented to person, place, and time. He exhibits normal muscle tone.  Skin: Skin is warm. Capillary refill takes less than 2 seconds.  Psychiatric: He has a normal mood and affect.  Nursing note and  vitals reviewed.    ED Treatments / Results  Labs (all labs ordered are listed, but only abnormal results are displayed) Labs Reviewed  URINALYSIS, ROUTINE W REFLEX MICROSCOPIC - Abnormal; Notable for the following components:      Result Value   APPearance TURBID (*)    Hgb urine dipstick MODERATE (*)    Protein, ur 30 (*)    Nitrite POSITIVE (*)    Leukocytes, UA LARGE (*)    All other components within normal limits  URINALYSIS, MICROSCOPIC (REFLEX) - Abnormal; Notable for the following components:   Bacteria, UA MANY (*)    All other components within normal limits  BASIC METABOLIC PANEL - Abnormal; Notable for the following components:   Glucose, Bld 151 (*)    All other components within normal limits  URINE CULTURE  CBC WITH DIFFERENTIAL/PLATELET    EKG None  Radiology Ct Abdomen Pelvis W Contrast  Result Date: 01/14/2018 CLINICAL DATA:  Groin swelling and pain for 3 days. Surgery for prostate hypertrophy 1 month ago. EXAM: CT ABDOMEN AND PELVIS WITH CONTRAST TECHNIQUE: Multidetector CT imaging of the abdomen and pelvis was performed using the standard protocol following bolus administration of intravenous contrast. CONTRAST:  168mL ISOVUE-300 IOPAMIDOL (ISOVUE-300) INJECTION 61% COMPARISON:  MRI abdomen 04/26/2012. FINDINGS: Lower chest: Lung bases are clear. Hepatobiliary: No focal liver abnormality is seen. No gallstones, gallbladder wall thickening, or biliary dilatation. Pancreas: Small circumscribed cystic lesion in the junction of the body and tail of the pancreas measuring about 11 mm diameter. Another small circumscribed lesion at the junction of the head and body of the pancreas measuring 10 mm diameter. Lesions are unchanged since previous MRI. No pancreatic ductal dilatation or inflammatory infiltration. Spleen: Normal in size without focal abnormality. Adrenals/Urinary Tract: No adrenal gland nodules. Stone in the lower pole right kidney measuring 7 mm diameter.  No hydronephrosis or hydroureter. Bladder wall is diffusely thickened. There is a large posterior and several smaller right posterolateral bladder diverticula. This may indicate changes from bladder outlet obstruction. No intraluminal filling defects. Stomach/Bowel: Stomach is within normal limits. Appendix appears normal. No evidence of bowel wall thickening, distention, or inflammatory changes. Vascular/Lymphatic: Aortic atherosclerosis. No enlarged abdominal or pelvic lymph nodes. Reproductive: Prostate gland is enlarged, measuring 4.7 cm diameter. Central prostate defect consistent with previous trans urethral resection. Left hemiscrotum demonstrates a small hydrocele with multiple left hemiscrotal varices. Other: No free air or free fluid in the abdomen. Abdominal wall musculature appears intact. Musculoskeletal: Degenerative changes in the spine with bridging anterior osteophytes. No destructive bone lesions. IMPRESSION: 1. No evidence of bowel obstruction or inflammation. 2. Bladder wall is diffusely thickened and there is a large posterior and several smaller right posterolateral bladder diverticula. This may indicate changes from bladder outlet obstruction. 3. 7 mm nonobstructing stone in the lower pole right kidney. 4. Small cystic lesions in the pancreas, unchanged since previous MRI. 5. Enlarged prostate gland with central prostate defect consistent with previous trans urethral resection. 6. Small left scrotal hydrocele and left scrotal varicoceles. Aortic Atherosclerosis (ICD10-I70.0). Electronically Signed   By: Lucienne Capers M.D.   On: 01/14/2018 00:56    Procedures Procedures (including critical care time)  Medications Ordered in ED Medications  0.9 %  sodium chloride infusion ( Intravenous Stopped 01/14/18 0116)  cefTRIAXone (ROCEPHIN) 2 g in sodium chloride 0.9 % 100 mL IVPB (0 g Intravenous Stopped 01/14/18 0004)  iopamidol (ISOVUE-300) 61 % injection 100 mL (100 mLs Intravenous  Contrast Given 01/14/18 0018)     Initial Impression / Assessment and Plan / ED Course  I have reviewed the triage vital signs and the nursing notes.  Pertinent labs & imaging results that were available during my care of the patient were reviewed by me and considered in my medical decision making (see chart for details).     72 year old male here with dysuria and abdominal pain.  History and lab work is consistent with likely recurrent UTI, likely due to his history of BPH and chronic urinary retention with bladder diverticula.  He has no fever, no flank pain, normal white count, normal renal function, no evidence to suggest pyelonephritis.  CT obtained given his recent surgery and there is no evidence of prostatic or other pelvic or abdominal abscess or other complication.  He does have a hydrocele, but testes appear  descended clinically bilaterally.  Of note, the patient does have evidence of mild to moderate urinary retention here with bladder scans.  This is been an ongoing issue and patient and family declined Foley catheter placement.  Will start the patient on antibiotics for complicated UTI.  He has an appointment with his urologist on Monday.  Return precautions given.  Urine culture sent.  Final Clinical Impressions(s) / ED Diagnoses   Final diagnoses:  Acute cystitis with hematuria  Benign prostatic hyperplasia with urinary frequency    ED Discharge Orders         Ordered    cefpodoxime (VANTIN) 200 MG tablet  2 times daily     01/14/18 0231    naproxen (NAPROSYN) 375 MG tablet  2 times daily PRN     01/14/18 0231    ondansetron (ZOFRAN ODT) 4 MG disintegrating tablet  Every 8 hours PRN     01/14/18 0232           Duffy Bruce, MD 01/14/18 [phone]

## 2018-01-14 DIAGNOSIS — N3001 Acute cystitis with hematuria: Secondary | ICD-10-CM | POA: Diagnosis not present

## 2018-01-14 MED ORDER — ONDANSETRON 4 MG PO TBDP: 4.0000 mg | ORAL_TABLET | Freq: Three times a day (TID) | ORAL | 0 refills | Status: AC | PRN

## 2018-01-14 MED ORDER — IOPAMIDOL (ISOVUE-300) INJECTION 61%
100.0000 mL | Freq: Once | INTRAVENOUS | Status: AC | PRN
  Administered 2018-01-14: 100 mL via INTRAVENOUS

## 2018-01-14 MED ORDER — NAPROXEN 375 MG PO TABS: 375.0000 mg | ORAL_TABLET | Freq: Two times a day (BID) | ORAL | 0 refills | Status: AC | PRN

## 2018-01-14 MED ORDER — CEFPODOXIME PROXETIL 200 MG PO TABS: 200.0000 mg | ORAL_TABLET | Freq: Two times a day (BID) | ORAL | 0 refills | Status: AC

## 2018-01-14 NOTE — ED Notes (Signed)
Patient transported to CT 

## 2018-01-14 NOTE — Discharge Instructions (Signed)
Follow-up with your Urologist on Monday  Take the antibiotic as prescribed  If you develop fever, nausea, vomiting, worsening back or abdominal pain, return to the ER  If you feel you cannot void/urinate, return to the ER

## 2018-01-14 NOTE — ED Notes (Signed)
Pt and family understood dc material. NAD noted 

## 2018-01-16 LAB — URINE CULTURE: Culture: >=100000 — AB

## 2018-01-17 ENCOUNTER — Telehealth: Payer: Self-pay | Admitting: *Deleted

## 2018-01-17 NOTE — Telephone Encounter (Signed)
Post ED Visit - Positive Culture Follow-up  Culture report reviewed by antimicrobial stewardship pharmacist:  []  Elenor Quinones, Pharm.D. []  Heide Guile, Pharm.D., BCPS AQ-ID []  Parks Neptune, Pharm.D., BCPS []  Alycia Rossetti, Pharm.D., BCPS []  Bairoa La Veinticinco, Pharm.D., BCPS, AAHIVP []  Legrand Como, Pharm.D., BCPS, AAHIVP []  Salome Arnt, PharmD, BCPS []  Johnnette Gourd, PharmD, BCPS []  Hughes Better, PharmD, BCPS []  Leeroy Cha, PharmD Elicia Lamp, PharD  Positive urine culture Treated with Cefpodoxime Proxetil, organism sensitive to the same and no further patient follow-up is required at this time.  Harlon Flor [facility] 01/17/2018, 10:57 AM

## 2019-10-11 IMAGING — CT CT ABD-PELV W/ CM
2 of 5 series · 15 of 46 positions shown, 17 images · IV contrast (APPLIED)
Comparison: MRI abdomen 04/26/2012.

CLINICAL DATA: Groin swelling and pain for 3 days. Surgery for
prostate hypertrophy 1 month ago.

EXAM:
CT ABDOMEN AND PELVIS WITH CONTRAST
TECHNIQUE: Multidetector CT imaging of the abdomen and pelvis was performed
using the standard protocol following bolus administration of
intravenous contrast.
CONTRAST:  100mL WS4P9B-B22 IOPAMIDOL (WS4P9B-B22) INJECTION 61%

[Series 2: axial st · axial · 0.80mm/px · z∈[-473,-3]mm · 12 of 104 slices shown, 14 images]
[im 5/104  soft-tissue]
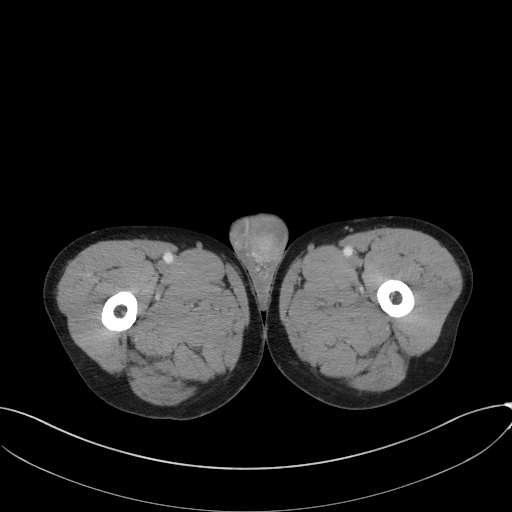
[im 5/104  bone]
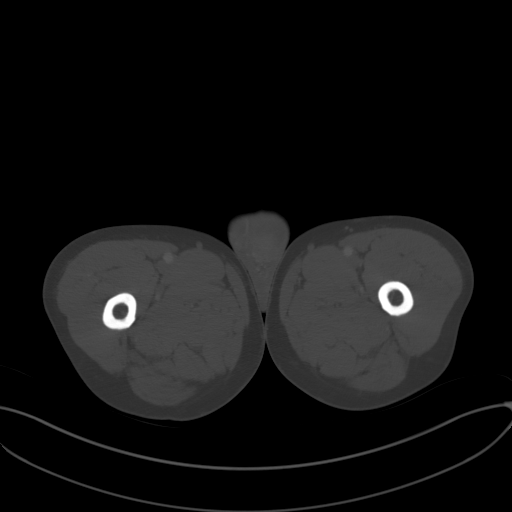
[im 15/104  soft-tissue]
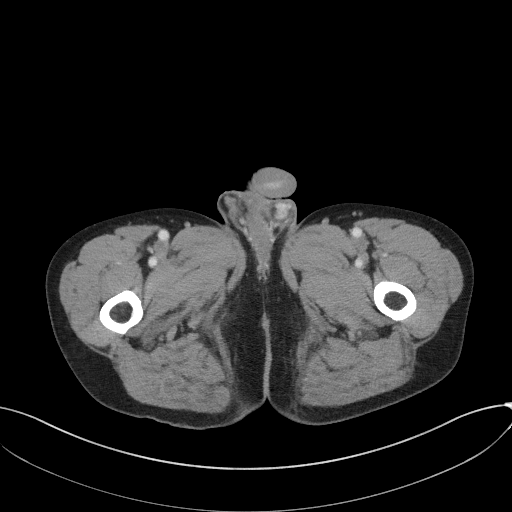
[im 25/104  soft-tissue]
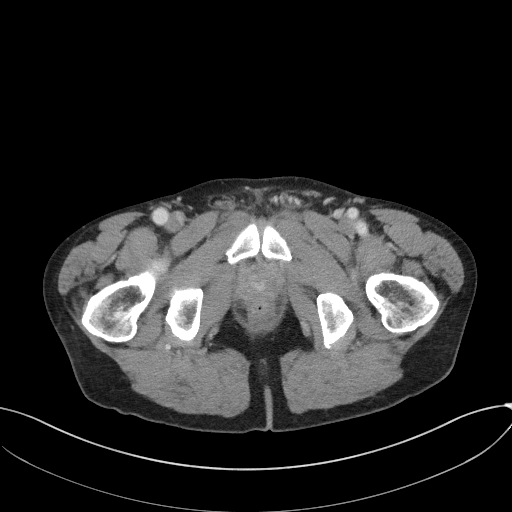
[im 30/104  soft-tissue]
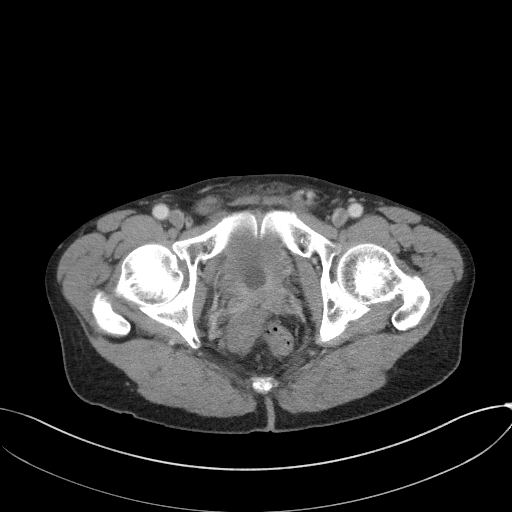
[im 40/104  soft-tissue]
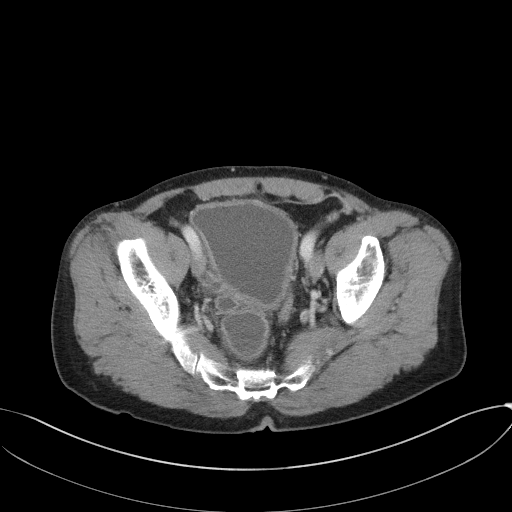
[im 50/104  soft-tissue]
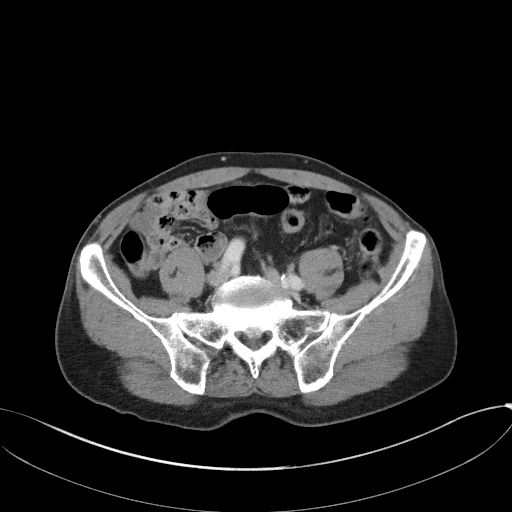
[im 54/104  soft-tissue]
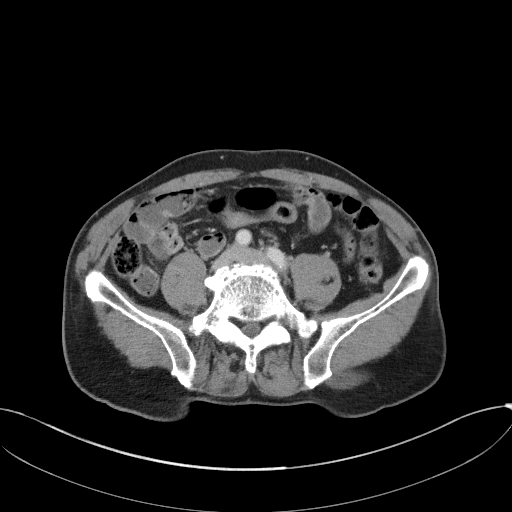
[im 64/104  soft-tissue]
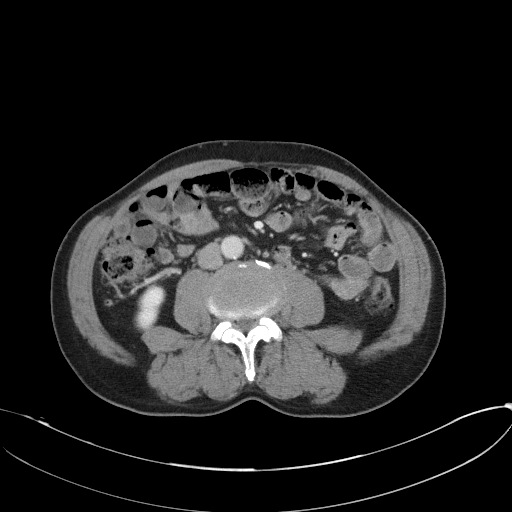
[im 74/104  soft-tissue]
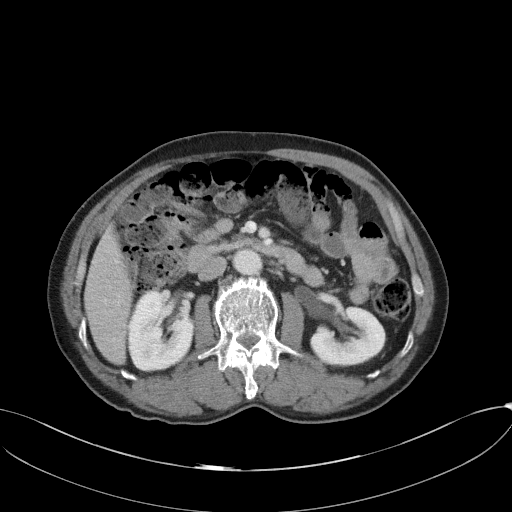
[im 74/104  bone]
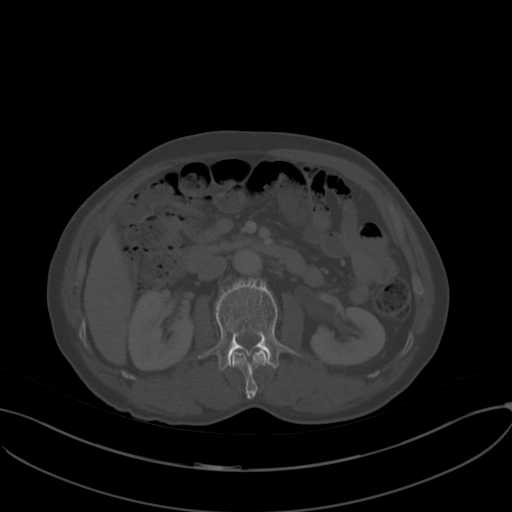
[im 79/104  soft-tissue]
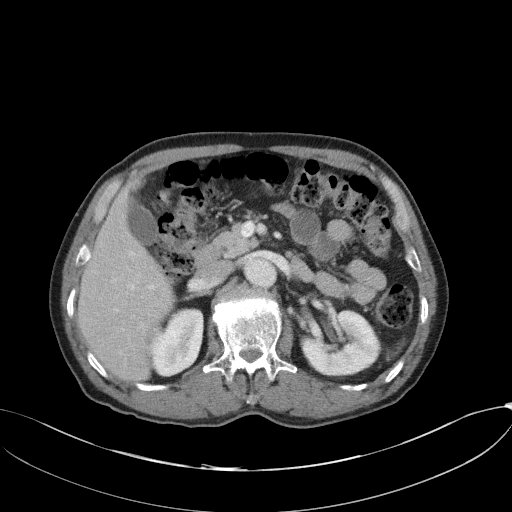
[im 89/104  soft-tissue]
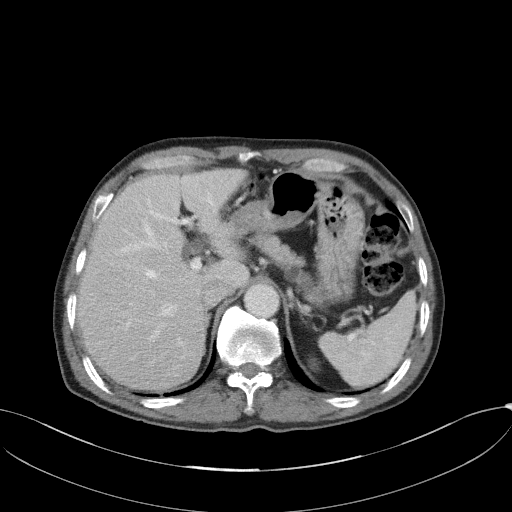
[im 99/104  soft-tissue]
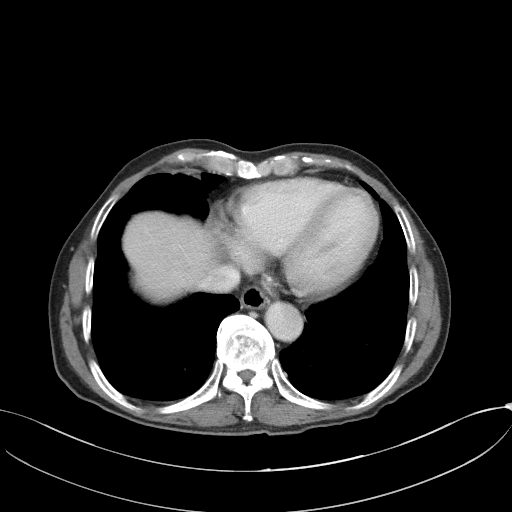

[Series 5: coronal st · coronal · 0.78mm/px · 3 of 80 slices shown]
[im 27/80  soft-tissue]
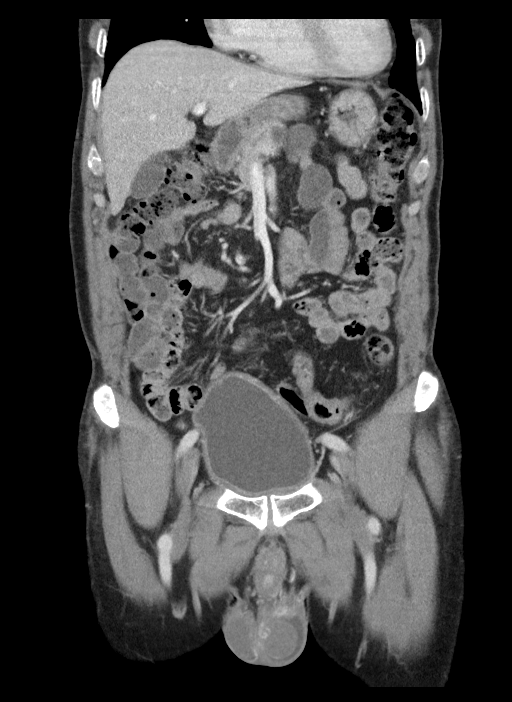
[im 36/80  soft-tissue]
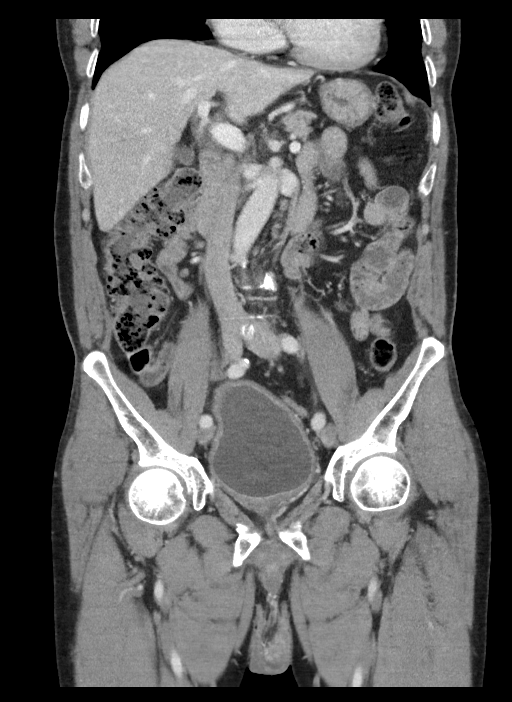
[im 44/80  soft-tissue]
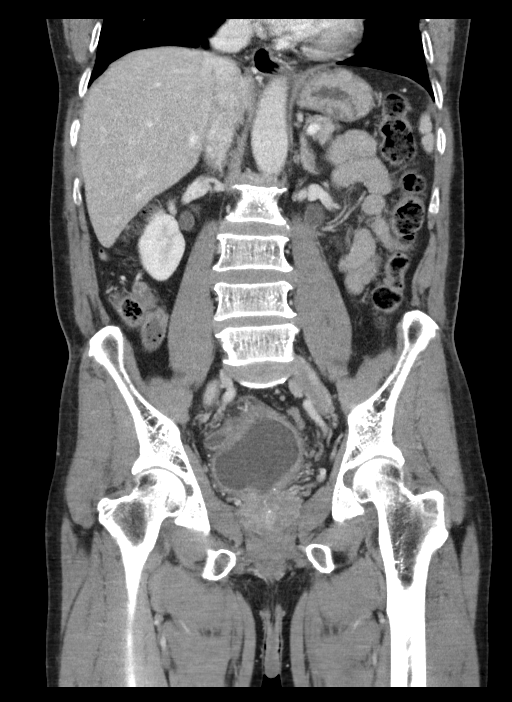

[15 of 46 positions shown; findings below may reference images not displayed]

FINDINGS: Lower chest: Lung bases are clear.

Hepatobiliary: No focal liver abnormality is seen. No gallstones,
gallbladder wall thickening, or biliary dilatation.

Pancreas: Small circumscribed cystic lesion in the junction of the
body and tail of the pancreas measuring about 11 mm diameter.
Another small circumscribed lesion at the junction of the head and
body of the pancreas measuring 10 mm diameter. Lesions are unchanged
since previous MRI. No pancreatic ductal dilatation or inflammatory
infiltration.

Spleen: Normal in size without focal abnormality.

Adrenals/Urinary Tract: No adrenal gland nodules. Stone in the lower
pole right kidney measuring 7 mm diameter. No hydronephrosis or
hydroureter. Bladder wall is diffusely thickened. There is a large
posterior and several smaller right posterolateral bladder
diverticula. This may indicate changes from bladder outlet
obstruction. No intraluminal filling defects.

Stomach/Bowel: Stomach is within normal limits. Appendix appears
normal. No evidence of bowel wall thickening, distention, or
inflammatory changes.

Vascular/Lymphatic: Aortic atherosclerosis. No enlarged abdominal or
pelvic lymph nodes.

Reproductive: Prostate gland is enlarged, measuring 4.7 cm diameter.
Central prostate defect consistent with previous trans urethral
resection. Left hemiscrotum demonstrates a small hydrocele with
multiple left hemiscrotal varices.

Other: No free air or free fluid in the abdomen. Abdominal wall
musculature appears intact.

Musculoskeletal: Degenerative changes in the spine with bridging
anterior osteophytes. No destructive bone lesions.
IMPRESSION: 1. No evidence of bowel obstruction or inflammation.
2. Bladder wall is diffusely thickened and there is a large
posterior and several smaller right posterolateral bladder
diverticula. This may indicate changes from bladder outlet
obstruction.
3. 7 mm nonobstructing stone in the lower pole right kidney.
4. Small cystic lesions in the pancreas, unchanged since previous
MRI.
5. Enlarged prostate gland with central prostate defect consistent
with previous trans urethral resection.
6. Small left scrotal hydrocele and left scrotal varicoceles.

Aortic Atherosclerosis ([id number]-7B2.2).

## 2021-02-08 ENCOUNTER — Emergency Department (HOSPITAL_BASED_OUTPATIENT_CLINIC_OR_DEPARTMENT_OTHER): Payer: Medicare HMO

## 2021-02-08 ENCOUNTER — Emergency Department (HOSPITAL_BASED_OUTPATIENT_CLINIC_OR_DEPARTMENT_OTHER)
Admission: EM | Admit: 2021-02-08 | Discharge: 2021-02-09 | Disposition: A | Discharge status: Home / Self Care | Payer: Medicare HMO | Attending: Emergency Medicine | Admitting: Emergency Medicine

## 2021-02-08 ENCOUNTER — Other Ambulatory Visit: Payer: Self-pay

## 2021-02-08 ENCOUNTER — Encounter (HOSPITAL_BASED_OUTPATIENT_CLINIC_OR_DEPARTMENT_OTHER): Payer: Self-pay

## 2021-02-08 DIAGNOSIS — Z79899 Other long term (current) drug therapy: Secondary | ICD-10-CM | POA: Insufficient documentation

## 2021-02-08 DIAGNOSIS — I10 Essential (primary) hypertension: Secondary | ICD-10-CM | POA: Insufficient documentation

## 2021-02-08 DIAGNOSIS — Z7982 Long term (current) use of aspirin: Secondary | ICD-10-CM | POA: Diagnosis not present

## 2021-02-08 DIAGNOSIS — R319 Hematuria, unspecified: Secondary | ICD-10-CM | POA: Insufficient documentation

## 2021-02-08 DIAGNOSIS — R160 Hepatomegaly, not elsewhere classified: Secondary | ICD-10-CM | POA: Diagnosis not present

## 2021-02-08 DIAGNOSIS — J449 Chronic obstructive pulmonary disease, unspecified: Secondary | ICD-10-CM | POA: Insufficient documentation

## 2021-02-08 DIAGNOSIS — Z87891 Personal history of nicotine dependence: Secondary | ICD-10-CM | POA: Diagnosis not present

## 2021-02-08 DIAGNOSIS — Z7951 Long term (current) use of inhaled steroids: Secondary | ICD-10-CM | POA: Diagnosis not present

## 2021-02-08 LAB — URINALYSIS, ROUTINE W REFLEX MICROSCOPIC
Bilirubin Urine: NEGATIVE
Glucose, UA: NEGATIVE mg/dL
Ketones, ur: NEGATIVE mg/dL
Leukocytes,Ua: NEGATIVE
Nitrite: NEGATIVE
Protein, ur: NEGATIVE mg/dL
Specific Gravity, Urine: 1.02 (ref 1.005–1.030)
pH: 7 (ref 5.0–8.0)

## 2021-02-08 LAB — URINALYSIS, MICROSCOPIC (REFLEX): WBC, UA: NONE SEEN WBC/hpf (ref 0–5)

## 2021-02-08 NOTE — ED Provider Notes (Signed)
[place] [facility] EMERGENCY DEPARTMENT Provider Note  CSN: [id number] Arrival date & time: 02/08/21 2144    History Chief Complaint  Patient presents with   Hematuria    Theodore Hess is a 75 y.o. male with history of known bladder diverticula followed by Urology in [facility] brought to the ED by his son for evaluation of intermittent hematuria. Patient's son interprets at his request. Patient has had intermittent hematuria for several weeks/months. Had a course of antibiotics recently which helped but blood returned a few days later. Some increased urination recently. He saw Urology mid-October and scheduled for CT and cystoscopy in December. He has not had any fever, vomiting or flank pain.    Past Medical History:  Diagnosis Date   COPD (chronic obstructive pulmonary disease) ([place])    Hearing loss    Heart disease    Hypertension    Joint pain    Kidney stone    Liver disease    OSA (obstructive sleep apnea) 02/19/2013   Does not like to wear CPAP   Sinoatrial node dysfunction ([place]) 02/19/2013   Off atenolol 2013   Sinus node dysfunction ([facility])     Past Surgical History:  Procedure Laterality Date   TRANSURETHRAL RESECTION OF PROSTATE      Family History  Problem Relation Age of Onset   Stroke Sister     Social History   Tobacco Use   Smoking status: Former   Smokeless tobacco: Never  Scientific laboratory technician Use: Never used  Substance Use Topics   Alcohol use: Not Currently    Alcohol/week: 1.0 standard drink    Types: 1 Glasses of wine per week   Drug use: No     Home Medications Prior to Admission medications   Medication Sig Start Date End Date Taking? Authorizing Provider  Albuterol Sulfate (PROAIR HFA IN) Inhale 8.5 mg into the lungs.    [provider]  aspirin 81 MG tablet Take 81 mg by mouth daily.      [provider]  diclofenac sodium (VOLTAREN) 1 % GEL Apply topically 4 (four) times daily.    [provider]   dutasteride (AVODART) 0.5 MG capsule Take 0.5 mg by mouth daily.      [provider]  fluticasone (FLONASE) 50 MCG/ACT nasal spray Place 2 sprays into the nose daily.      [provider]  losartan (COZAAR) 25 MG tablet Take 25 mg by mouth daily.    [provider]  ondansetron (ZOFRAN ODT) 4 MG disintegrating tablet Take 1 tablet (4 mg total) by mouth every 8 (eight) hours as needed for nausea or vomiting. 01/14/18   Duffy Bruce, MD  Silodosin (RAPAFLO PO) Take by mouth.      [provider]  Tiotropium Bromide Monohydrate (SPIRIVA HANDIHALER IN) Inhale into the lungs.    [provider]     Allergies    Patient has no known allergies.   Review of Systems   Review of Systems A comprehensive review of systems was completed and negative except as noted in HPI.    Physical Exam BP (!) 145/89 (BP Location: Left Arm)   Pulse 62   Temp 98.3 F (36.8 C) (Oral)   Resp 16   Ht 5\' 3"  (1.6 m)   Wt 65.8 kg   SpO2 97%   BMI 25.69 kg/m   Physical Exam Vitals and nursing note reviewed.  Constitutional:      Appearance:  Normal appearance.  HENT:     Head: Normocephalic and atraumatic.     Nose: Nose normal.     Mouth/Throat:     Mouth: Mucous membranes are moist.  Eyes:     Extraocular Movements: Extraocular movements intact.     Conjunctiva/sclera: Conjunctivae normal.  Cardiovascular:     Rate and Rhythm: Normal rate.  Pulmonary:     Effort: Pulmonary effort is normal.     Breath sounds: Normal breath sounds.  Abdominal:     General: Abdomen is flat.     Palpations: Abdomen is soft.     Tenderness: There is no abdominal tenderness.  Musculoskeletal:        General: No swelling. Normal range of motion.     Cervical back: Neck supple.  Skin:    General: Skin is warm and dry.  Neurological:     General: No focal deficit present.     Mental Status: He is alert.  Psychiatric:        Mood and Affect: Mood normal.     ED  Results / Procedures / Treatments   Labs (all labs ordered are listed, but only abnormal results are displayed) Labs Reviewed  URINALYSIS, ROUTINE W REFLEX MICROSCOPIC - Abnormal; Notable for the following components:      Result Value   Hgb urine dipstick MODERATE (*)    All other components within normal limits  URINALYSIS, MICROSCOPIC (REFLEX) - Abnormal; Notable for the following components:   Bacteria, UA RARE (*)    All other components within normal limits  URINE CULTURE    EKG None   Radiology CT Renal Stone Study  Result Date: 02/09/2021 CLINICAL DATA:  Hematuria EXAM: CT ABDOMEN AND PELVIS WITHOUT CONTRAST TECHNIQUE: Multidetector CT imaging of the abdomen and pelvis was performed following the standard protocol without IV contrast. COMPARISON:  CTA abdomen dated 07/23/2019. CT abdomen/pelvis dated 01/14/2018. FINDINGS: Motion degraded images. Lower chest: Lung bases are clear. Hepatobiliary: 4.8 cm mass in the central right hepatic dome (series 2/image 9). 14 mm lesion inferiorly in the right hepatic lobe (series 2/image 31). These are new from the prior exam and raise concern for metastatic disease. Gallbladder is decompressed. No intrahepatic or extrahepatic ductal dilatation. Pancreas: 10 mm cyst along the pancreatic body/tail (series 2/image 17), benign. Additional pancreatic tail cyst is not well visualized on the current motion degraded study. Spleen: Within normal limits. Adrenals/Urinary Tract: Adrenal glands are within normal limits. 7 mm nonobstructing right lower pole renal calculus. Left kidney is within normal limits. No hydronephrosis. Thick-walled bladder with a moderate right posterior bladder diverticulum (series 2/image 64). Stomach/Bowel: Stomach is distended with debris. No evidence of bowel obstruction. Normal appendix (series 2/image 36). Moderate colonic stool burden, suggesting mild constipation. No colonic wall thickening or mass is evident on CT.  Vascular/Lymphatic: No evidence of abdominal aortic aneurysm. Atherosclerotic calcifications of the abdominal aorta and branch vessels. No suspicious abdominopelvic lymphadenopathy. Reproductive: Prostate is notable for prior TURP. 2 mm calculus within the TURP defect (series 2/image 32). Other: No abdominopelvic ascites. Musculoskeletal: Degenerative changes of the visualized thoracolumbar spine. IMPRESSION: Motion degraded images. 7 mm nonobstructing right lower pole renal calculus. No hydronephrosis. Additional 2 mm layering calculus within the TURP defect. 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 A primary lesion is not evident on the current study. These results were called by telephone at the time of interpretation on 02/09/2021 at 12:10 am to provider  Calvert Cantor , who verbally acknowledged these results. Electronically Signed   By: Julian Hy M.D.   On: 02/09/2021 00:10    Procedures Procedures  Medications Ordered in the ED Medications - No data to display   MDM Rules/Calculators/A&P MDM Patient with known bladder diverticula and prior renal stone here for intermittent hematuria. No significant hematuria or signs of infection on UA today. Will CT to eval retained stone. Recommend outpatient urology follow up either way. Son is in agreement with plan.   ED Course  I have reviewed the triage vital signs and the nursing notes.  Pertinent labs & imaging results that were available during my care of the patient were reviewed by me and considered in my medical decision making (see chart for details).  Clinical Course as of 02/09/21 0041  Tue Feb 09, 2021  0037 Reviewed CT images and discussed with radiologist. Patient's son states he has been followed for liver mass for many years and it was not a cancerous mass. He has not seen his liver doctor in several years because it was not causing any symptoms. Recommend he follow up  with them to re-evaluate given findings today.  Otherwise he may have a retained bladder stone. Recommend close outpatient Urology follow up for further management.  [CS]    Clinical Course User Index [CS] Truddie Hidden, MD    Final Clinical Impression(s) / ED Diagnoses Final diagnoses:  Hematuria, unspecified type  Liver mass    Rx / DC Orders ED Discharge Orders     None        Truddie Hidden, MD 02/09/21 0041

## 2021-02-08 NOTE — ED Triage Notes (Signed)
Per son/interpreter pt with hematuria x 3 weeks-seen x 1 with abx completed-feels worse x 3 days-NAD-steady gait

## 2021-02-10 LAB — URINE CULTURE: Culture: NO GROWTH

## 2021-10-23 ENCOUNTER — Emergency Department (HOSPITAL_BASED_OUTPATIENT_CLINIC_OR_DEPARTMENT_OTHER)
Admission: EM | Admit: 2021-10-23 | Discharge: 2021-10-23 | Disposition: A | Discharge status: Home / Self Care | Payer: Medicare HMO | Attending: Emergency Medicine | Admitting: Emergency Medicine

## 2021-10-23 ENCOUNTER — Emergency Department (HOSPITAL_BASED_OUTPATIENT_CLINIC_OR_DEPARTMENT_OTHER): Payer: Medicare HMO

## 2021-10-23 ENCOUNTER — Encounter (HOSPITAL_BASED_OUTPATIENT_CLINIC_OR_DEPARTMENT_OTHER): Payer: Self-pay | Admitting: Emergency Medicine

## 2021-10-23 DIAGNOSIS — Z79899 Other long term (current) drug therapy: Secondary | ICD-10-CM | POA: Insufficient documentation

## 2021-10-23 DIAGNOSIS — R197 Diarrhea, unspecified: Secondary | ICD-10-CM | POA: Diagnosis present

## 2021-10-23 DIAGNOSIS — D649 Anemia, unspecified: Secondary | ICD-10-CM | POA: Diagnosis not present

## 2021-10-23 DIAGNOSIS — J45909 Unspecified asthma, uncomplicated: Secondary | ICD-10-CM | POA: Diagnosis not present

## 2021-10-23 DIAGNOSIS — I10 Essential (primary) hypertension: Secondary | ICD-10-CM | POA: Insufficient documentation

## 2021-10-23 DIAGNOSIS — C49A2 Gastrointestinal stromal tumor of stomach: Secondary | ICD-10-CM | POA: Insufficient documentation

## 2021-10-23 DIAGNOSIS — Z7951 Long term (current) use of inhaled steroids: Secondary | ICD-10-CM | POA: Insufficient documentation

## 2021-10-23 DIAGNOSIS — Z7982 Long term (current) use of aspirin: Secondary | ICD-10-CM | POA: Insufficient documentation

## 2021-10-23 HISTORY — DX: Malignant (primary) neoplasm, unspecified: C80.1

## 2021-10-23 LAB — CBC WITH DIFFERENTIAL/PLATELET
Abs Immature Granulocytes: 0.01 10*3/uL (ref 0.00–0.07)
Basophils Absolute: 0 10*3/uL (ref 0.0–0.1)
Basophils Relative: 0 %
Eosinophils Absolute: 0.1 10*3/uL (ref 0.0–0.5)
Eosinophils Relative: 1 %
HCT: 38.5 % — ABNORMAL LOW (ref 39.0–52.0)
Hemoglobin: 12.9 g/dL — ABNORMAL LOW (ref 13.0–17.0)
Immature Granulocytes: 0 %
Lymphocytes Relative: 12 %
Lymphs Abs: 0.6 10*3/uL — ABNORMAL LOW (ref 0.7–4.0)
MCH: 33.5 pg (ref 26.0–34.0)
MCHC: 33.5 g/dL (ref 30.0–36.0)
MCV: 100 fL (ref 80.0–100.0)
Monocytes Absolute: 0.4 10*3/uL (ref 0.1–1.0)
Monocytes Relative: 7 %
Neutro Abs: 4 10*3/uL (ref 1.7–7.7)
Neutrophils Relative %: 80 %
Platelets: 134 10*3/uL — ABNORMAL LOW (ref 150–400)
RBC: 3.85 MIL/uL — ABNORMAL LOW (ref 4.22–5.81)
RDW: 12.2 % (ref 11.5–15.5)
WBC: 5.1 10*3/uL (ref 4.0–10.5)
nRBC: 0 % (ref 0.0–0.2)

## 2021-10-23 LAB — BASIC METABOLIC PANEL
Anion gap: 5 (ref 5–15)
BUN: 15 mg/dL (ref 8–23)
CO2: 23 mmol/L (ref 22–32)
Calcium: 8.4 mg/dL — ABNORMAL LOW (ref 8.9–10.3)
Chloride: 109 mmol/L (ref 98–111)
Creatinine, Ser: 0.7 mg/dL (ref 0.61–1.24)
GFR, Estimated: >60 mL/min (ref >60)
Glucose, Bld: 98 mg/dL (ref 70–99)
Potassium: 4 mmol/L (ref 3.5–5.1)
Sodium: 137 mmol/L (ref 135–145)

## 2021-10-23 MED ORDER — IOHEXOL 300 MG/ML  SOLN
80.0000 mL | Freq: Once | INTRAMUSCULAR | Status: AC | PRN
  Administered 2021-10-23: 80 mL via INTRAVENOUS

## 2021-10-23 MED ORDER — LACTATED RINGERS IV BOLUS
1000.0000 mL | Freq: Once | INTRAVENOUS | Status: AC
Start: 2021-10-23 — End: 2021-10-23
  Administered 2021-10-23: 1000 mL via INTRAVENOUS

## 2021-10-23 NOTE — Discharge Instructions (Addendum)
Ensure you are continuing to push electrolyte containing fluids to remain hydrated.  Your laboratory work-up was reassuring.  Your CT work-up is as follows: IMPRESSION:  1. Fluid-filled loops of large bowel noted which may reflect  underlying diarrheal state.  2. 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  3. Slight decrease in size of porta hepatic lymph node.  4. Liver metastases are again noted. The dominant liver metastasis  is slightly increased in the interval and the smaller lesion is  decreased in the interval. Overall, no significant in tumor burden  within the liver.  5. Nonobstructing right renal calculus.  6. Multiple bladder diverticula.  7. Prostate gland enlargement with evidence of previous TURP.  8. Aortic Atherosclerosis (ICD10-I70.0).

## 2021-10-23 NOTE — ED Provider Notes (Signed)
[facility] EMERGENCY DEPARTMENT Provider Note   CSN: [id number] Arrival date & time: 10/23/21  1101     History  Chief Complaint  Patient presents with   Diarrhea    ZYMIR NAPOLI is a 76 y.o. male.   Diarrhea Associated symptoms: abdominal pain     76 year old male with medical history significant for HTN, COPD, OSA, GIST who presents to the emergency department with uncontrolled diarrhea.  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  He is currently on imatinib 400 mg daily.  He endorses crampy generalized abdominal discomfort.  For the past 24 hours, he has had greater than 20 episodes of watery diarrhea.  No blood.  No fevers or chills.  No nausea or vomiting, hematemesis. He is tolerating oral intake.  Home Medications Prior to Admission medications   Medication Sig Start Date End Date Taking? Authorizing Provider  Albuterol Sulfate (PROAIR HFA IN) Inhale 8.5 mg into the lungs.    [provider]  aspirin 81 MG tablet Take 81 mg by mouth daily.      [provider]  diclofenac sodium (VOLTAREN) 1 % GEL Apply topically 4 (four) times daily.    [provider]  dutasteride (AVODART) 0.5 MG capsule Take 0.5 mg by mouth daily.      [provider]  fluticasone (FLONASE) 50 MCG/ACT nasal spray Place 2 sprays into the nose daily.      [provider]  losartan (COZAAR) 25 MG tablet Take 25 mg by mouth daily.    [provider]  ondansetron (ZOFRAN ODT) 4 MG disintegrating tablet Take 1 tablet (4 mg total) by mouth every 8 (eight) hours as needed for nausea or vomiting. 01/14/18   Duffy Bruce, MD  Silodosin (RAPAFLO PO) Take by mouth.      [provider]  Tiotropium Bromide Monohydrate (SPIRIVA HANDIHALER IN) Inhale into the lungs.    [provider]      Allergies    Patient  has no known allergies.    Review of Systems   Review of Systems  Gastrointestinal:  Positive for abdominal pain and diarrhea.  All other systems reviewed and are negative.   Physical Exam Updated Vital Signs BP (!) 152/82   Pulse 63   Temp 98.8 F (37.1 C) (Oral)   Resp 16   Ht '5\' 3"'$  (1.6 m)   Wt 67.1 kg   SpO2 98%   BMI 26.22 kg/m  Physical Exam Vitals and nursing note reviewed.  Constitutional:      General: He is not in acute distress.    Appearance: He is well-developed.  HENT:     Head: Normocephalic and atraumatic.  Eyes:     Conjunctiva/sclera: Conjunctivae normal.  Cardiovascular:     Rate and Rhythm: Normal rate and regular rhythm.     Heart sounds: No murmur heard. Pulmonary:     Effort: Pulmonary effort is normal. No respiratory distress.     Breath sounds: Normal breath sounds.  Abdominal:     General: There is no distension.     Palpations: Abdomen is soft.     Tenderness: There is generalized abdominal tenderness.     Comments: Mild generalized abdominal tenderness without rebound or guarding  Musculoskeletal:        General: No swelling.     Cervical back: Neck supple.  Skin:    General: Skin is warm and dry.     Capillary Refill: Capillary refill takes less than 2 seconds.  Neurological:     Mental Status: He is alert.  Psychiatric:        Mood and Affect: Mood normal.     ED Results / Procedures / Treatments   Labs (all labs ordered are listed, but only abnormal results are displayed) Labs Reviewed  CBC WITH DIFFERENTIAL/PLATELET - Abnormal; Notable for the following components:      Result Value   RBC 3.85 (*)    Hemoglobin 12.9 (*)    HCT 38.5 (*)    Platelets 134 (*)    Lymphs Abs 0.6 (*)    All other components within normal limits  BASIC METABOLIC PANEL - Abnormal; Notable for the following components:   Calcium 8.4 (*)    All other components within normal limits    EKG None  Radiology CT ABDOMEN PELVIS W  CONTRAST  Result Date: 10/23/2021 CLINICAL DATA:  History of metastatic gastric/GI stromal tumor. Abdominal pain. Diarrhea since last night. EXAM: CT ABDOMEN AND PELVIS WITH CONTRAST TECHNIQUE: Multidetector CT imaging of the abdomen and pelvis was performed using the standard protocol following bolus administration of intravenous contrast. RADIATION DOSE REDUCTION: This exam was performed according to the departmental dose-optimization program which includes automated exposure control, adjustment of the mA and/or kV according to patient size and/or use of iterative reconstruction technique. CONTRAST:  57m OMNIPAQUE IOHEXOL 300 MG/ML  SOLN COMPARISON:  PET-CT 08/16/2021 02/08/2021 FINDINGS: Lower chest: No acute abnormality. Hepatobiliary: Metastasis within the dome of liver which measures 3.5 x 3.6 cm, image 8/2. This is compared with 3.2 x 3.0 cm on 08/16/2021. Inferior right lobe of liver metastasis measures 2.0 x 1.8 cm, image 28/2. Previously 2.2 x 2.0 cm. Several additional, too small to characterize low-density foci appears similar to CT dated 03/23/2021. Gallbladder appears unremarkable. No signs of bile duct dilatation. Pancreas: Unchanged cystic lesion within the body of pancreas measuring 1 cm, image 20/2. No solid pancreatic mass, inflammation or main duct dilatation Spleen: Spleen appears normal. Adrenals/Urinary Tract: The adrenal glands appear within normal limits. Stone within the inferior pole of the right kidney measures 6 mm. No left renal calculi. Similar scratch set unchanged appearance of bilateral pelvocaliectasis. No signs of obstructing ureteral calculi. There are multiple diverticula arising off the bladder. The largest arises off the posterior bladder wall measuring 5.0 x 3.6 cm, image 67/2. No focal enhancing bladder lesion identified Stomach/Bowel: The exophytic gastric lesion measures 3.9 by 2.8 cm, image 10/2. On the most recent PET-CT this measured 3.7 by 2.6 cm. Fluid-filled loops  of large and small bowel identified. There is no pathologic dilatation of the large or small bowel loops to suggest a bowel obstruction. No bowel wall thickening or inflammation identified. Vascular/Lymphatic: Aortic atherosclerosis. Soft tissue nodule within the porta hepatic region measures 1 x 1.8 cm, image 20/2. This is compared with 1.7 x 2.9 cm on PET-CT from 08/16/2021. No retroperitoneal adenopathy. No pelvic or inguinal adenopathy identified. Reproductive: Prostate gland enlargement with evidence of previous TURP. Other: No abdominal wall hernia or abnormality. No abdominopelvic ascites. Musculoskeletal: No acute or significant osseous findings. IMPRESSION: 1. Fluid-filled loops of large bowel noted which may reflect underlying diarrheal state. 2. Slight increase in size of exophytic gastric lesion compatible with known gastric gastrointestinal stromal tumor. 3. Slight decrease in size of porta hepatic lymph node. 4. Liver metastases are again noted. The dominant  liver metastasis is slightly increased in the interval and the smaller lesion is decreased in the interval. Overall, no significant in tumor burden within the liver. 5. Nonobstructing right renal calculus. 6. Multiple bladder diverticula. 7. Prostate gland enlargement with evidence of previous TURP. 8. Aortic Atherosclerosis (ICD10-I70.0). Electronically Signed   By: Kerby Moors M.D.   On: 10/23/2021 13:09    Procedures Procedures    Medications Ordered in ED Medications  lactated ringers bolus 1,000 mL (0 mLs Intravenous Stopped 10/23/21 1318)  iohexol (OMNIPAQUE) 300 MG/ML solution 80 mL (80 mLs Intravenous Contrast Given 10/23/21 1238)    ED Course/ Medical Decision Making/ A&P                           Medical Decision Making Amount and/or Complexity of Data Reviewed Labs: ordered. Radiology: ordered.  Risk Prescription drug management.    76 year old male with medical history significant for HTN, COPD, OSA, GIST who  presents to the emergency department with uncontrolled diarrhea.  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  He is currently on imatinib 400 mg daily.  He endorses crampy generalized abdominal discomfort.  For the past 24 hours, he has had greater than 20 episodes of watery diarrhea.  No blood.  No fevers or chills.  No nausea or vomiting, hematemesis. He is tolerating oral intake.  On arrival, the patient was vitally stable.  NSR noted on cardiac telemetry.  His physical exam was significant for an abdomen that was soft, very mild generalized tenderness to palpation without rebound or guarding.  He has moist mucous membranes and is overall well-appearing.  IV access was obtained and screening laboratory work-up was initiated.  The patient was administered an IV fluid bolus for volume resuscitation.  Laboratory evaluation significant for no leukocytosis, mild anemia to 12.9, BMP without evidence of an AKI, mild hypocalcemia at 8.4.  CT abdomen pelvis was performed which revealed the following: IMPRESSION:  1. Fluid-filled loops of large bowel noted which may reflect  underlying diarrheal state.  2. 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  3. Slight decrease in size of porta hepatic lymph node.  4. Liver metastases are again noted. The dominant liver metastasis  is slightly increased in the interval and the smaller lesion is  decreased in the interval. Overall, no significant in tumor burden  within the liver.  5. Nonobstructing right renal calculus.  6. Multiple bladder diverticula.  7. Prostate gland enlargement with evidence of previous TURP.  8. Aortic Atherosclerosis (ICD10-I70.0).   No evidence for bowel obstruction or other acute abnormality.  Fluid loops of bowel were noted consistent with the  patient's known diarrhea.  Potential association with the patient's GIST tumor.  No melena or hematochezia.  Low concern for acute GI bleed.  No significant increase in tumor burden noted on CT.  No evidence for bowel obstruction on CT.  Overall well-appearing and tolerating oral intake on repeat assessment status post IV hydration.  Plan for continued oral hydration at home, outpatient follow-up.  Stable at time of discharge.  Final Clinical Impression(s) / ED Diagnoses Final diagnoses:  Diarrhea, unspecified type  Malignant gastrointestinal stromal tumor (GIST) of stomach ([place])    Rx / DC Orders ED Discharge Orders     None  Regan Lemming, MD 10/23/21 1359

## 2021-10-23 NOTE — ED Triage Notes (Signed)
Pt reports diarrhea since last pm; anti-diarrheal not working

## 2021-12-12 ENCOUNTER — Encounter (HOSPITAL_BASED_OUTPATIENT_CLINIC_OR_DEPARTMENT_OTHER): Payer: Self-pay | Admitting: Emergency Medicine

## 2021-12-12 ENCOUNTER — Emergency Department (HOSPITAL_BASED_OUTPATIENT_CLINIC_OR_DEPARTMENT_OTHER): Payer: Medicare HMO

## 2021-12-12 ENCOUNTER — Emergency Department (HOSPITAL_BASED_OUTPATIENT_CLINIC_OR_DEPARTMENT_OTHER)
Admission: EM | Admit: 2021-12-12 | Discharge: 2021-12-12 | Disposition: A | Discharge status: Home / Self Care | Payer: Medicare HMO | Attending: Emergency Medicine | Admitting: Emergency Medicine

## 2021-12-12 ENCOUNTER — Other Ambulatory Visit: Payer: Self-pay

## 2021-12-12 DIAGNOSIS — I10 Essential (primary) hypertension: Secondary | ICD-10-CM | POA: Insufficient documentation

## 2021-12-12 DIAGNOSIS — Z7982 Long term (current) use of aspirin: Secondary | ICD-10-CM | POA: Insufficient documentation

## 2021-12-12 DIAGNOSIS — Z7951 Long term (current) use of inhaled steroids: Secondary | ICD-10-CM | POA: Insufficient documentation

## 2021-12-12 DIAGNOSIS — J45909 Unspecified asthma, uncomplicated: Secondary | ICD-10-CM | POA: Diagnosis not present

## 2021-12-12 DIAGNOSIS — Z79899 Other long term (current) drug therapy: Secondary | ICD-10-CM | POA: Insufficient documentation

## 2021-12-12 DIAGNOSIS — J449 Chronic obstructive pulmonary disease, unspecified: Secondary | ICD-10-CM | POA: Insufficient documentation

## 2021-12-12 DIAGNOSIS — Z7952 Long term (current) use of systemic steroids: Secondary | ICD-10-CM | POA: Insufficient documentation

## 2021-12-12 DIAGNOSIS — Z87891 Personal history of nicotine dependence: Secondary | ICD-10-CM | POA: Diagnosis not present

## 2021-12-12 DIAGNOSIS — R059 Cough, unspecified: Secondary | ICD-10-CM | POA: Diagnosis present

## 2021-12-12 DIAGNOSIS — U071 COVID-19: Secondary | ICD-10-CM | POA: Diagnosis not present

## 2021-12-12 DIAGNOSIS — J40 Bronchitis, not specified as acute or chronic: Secondary | ICD-10-CM

## 2021-12-12 HISTORY — DX: COVID-19: U07.1

## 2021-12-12 LAB — COMPREHENSIVE METABOLIC PANEL WITH GFR
ALT: 28 U/L (ref 0–44)
AST: 39 U/L (ref 15–41)
Albumin: 3.6 g/dL (ref 3.5–5.0)
Alkaline Phosphatase: 67 U/L (ref 38–126)
Anion gap: 8 (ref 5–15)
BUN: 11 mg/dL (ref 8–23)
CO2: 24 mmol/L (ref 22–32)
Calcium: 8.1 mg/dL — ABNORMAL LOW (ref 8.9–10.3)
Chloride: 106 mmol/L (ref 98–111)
Creatinine, Ser: 0.79 mg/dL (ref 0.61–1.24)
GFR, Estimated: >60 mL/min
Glucose, Bld: 105 mg/dL — ABNORMAL HIGH (ref 70–99)
Potassium: 3.3 mmol/L — ABNORMAL LOW (ref 3.5–5.1)
Sodium: 138 mmol/L (ref 135–145)
Total Bilirubin: 0.7 mg/dL (ref 0.3–1.2)
Total Protein: 6.4 g/dL — ABNORMAL LOW (ref 6.5–8.1)

## 2021-12-12 LAB — CBC WITH DIFFERENTIAL/PLATELET
Abs Immature Granulocytes: 0.01 10*3/uL (ref 0.00–0.07)
Basophils Absolute: 0 10*3/uL (ref 0.0–0.1)
Basophils Relative: 0 %
Eosinophils Absolute: 0.1 10*3/uL (ref 0.0–0.5)
Eosinophils Relative: 2 %
HCT: 37.5 % — ABNORMAL LOW (ref 39.0–52.0)
Hemoglobin: 12.8 g/dL — ABNORMAL LOW (ref 13.0–17.0)
Immature Granulocytes: 0 %
Lymphocytes Relative: 24 %
Lymphs Abs: 1.5 10*3/uL (ref 0.7–4.0)
MCH: 32.8 pg (ref 26.0–34.0)
MCHC: 34.1 g/dL (ref 30.0–36.0)
MCV: 96.2 fL (ref 80.0–100.0)
Monocytes Absolute: 0.7 10*3/uL (ref 0.1–1.0)
Monocytes Relative: 12 %
Neutro Abs: 3.9 10*3/uL (ref 1.7–7.7)
Neutrophils Relative %: 62 %
Platelets: 98 10*3/uL — ABNORMAL LOW (ref 150–400)
RBC: 3.9 MIL/uL — ABNORMAL LOW (ref 4.22–5.81)
RDW: 12.4 % (ref 11.5–15.5)
WBC: 6.3 10*3/uL (ref 4.0–10.5)
nRBC: 0 % (ref 0.0–0.2)

## 2021-12-12 LAB — LIPASE, BLOOD: Lipase: 28 U/L (ref 11–51)

## 2021-12-12 MED ORDER — ALBUTEROL SULFATE HFA 108 (90 BASE) MCG/ACT IN AERS: 1.0000 | INHALATION_SPRAY | Freq: Four times a day (QID) | RESPIRATORY_TRACT | 0 refills | Status: DC | PRN

## 2021-12-12 MED ORDER — PREDNISONE 20 MG PO TABS: 40.0000 mg | ORAL_TABLET | Freq: Every day | ORAL | 0 refills | Status: AC

## 2021-12-12 MED ORDER — PREDNISONE 20 MG PO TABS: 40.0000 mg | ORAL_TABLET | Freq: Every day | ORAL | 0 refills | Status: DC

## 2021-12-12 MED ORDER — METHYLPREDNISOLONE SODIUM SUCC 125 MG IJ SOLR
125.0000 mg | Freq: Once | INTRAMUSCULAR | Status: AC
Start: 2021-12-12 — End: 2021-12-12
  Administered 2021-12-12: 125 mg via INTRAVENOUS
  Filled 2021-12-12: qty 2

## 2021-12-12 MED ORDER — IPRATROPIUM-ALBUTEROL 0.5-2.5 (3) MG/3ML IN SOLN
3.0000 mL | Freq: Once | RESPIRATORY_TRACT | Status: AC
  Administered 2021-12-12: 3 mL via RESPIRATORY_TRACT
  Filled 2021-12-12: qty 3

## 2021-12-12 MED ORDER — METHYLPREDNISOLONE SODIUM SUCC 125 MG IJ SOLR: 125.0000 mg | Freq: Once | INTRAMUSCULAR | Status: DC

## 2021-12-12 MED ORDER — ALBUTEROL SULFATE HFA 108 (90 BASE) MCG/ACT IN AERS: 1.0000 | INHALATION_SPRAY | Freq: Four times a day (QID) | RESPIRATORY_TRACT | 0 refills | Status: AC | PRN

## 2021-12-12 MED ORDER — BENZONATATE 100 MG PO CAPS
100.0000 mg | ORAL_CAPSULE | Freq: Three times a day (TID) | ORAL | 0 refills | Status: DC
Start: 2021-12-12 — End: 2021-12-12

## 2021-12-12 MED ORDER — BENZONATATE 100 MG PO CAPS: 100.0000 mg | ORAL_CAPSULE | Freq: Three times a day (TID) | ORAL | 0 refills | Status: AC

## 2021-12-12 NOTE — ED Triage Notes (Signed)
Patient brought in by family for c/o positive for COVID with cough, fever, decrease appetite, body aches and swelling to face. Patient is on antiviral medication for COVID x 3 days. All family members had [place] as well.

## 2021-12-12 NOTE — ED Provider Notes (Signed)
[facility] EMERGENCY DEPARTMENT Provider Note   CSN: [id number] Arrival date & time: 12/12/21  7062     History  Chief Complaint  Patient presents with   Cough    Theodore Hess is a 76 y.o. male with history of hypertension, kidney stones, COPD, gastrointestinal stromal tumor, asthma who presents to the emergency department for evaluation of COVID symptoms x5 days.  Patient first developed symptoms 5 days ago and was diagnosed with COVID 4 days ago.  He was started on antiviral medications 3 days ago.  Son states patient has severe cough with productive sputum.  He has also been having intermittent subjective fevers and chills, sore throat, congestion, facial swelling, body aches and decreased appetite. Patient denies nausea, vomiting, diarrhea.  He has been taking over-the-counter cough medication without relief.  Patient recently saw his oncologist, Dr. Mikeal Hawthorne, on 12/08/2021 where he was diagnosed with cellulitis of the right leg after negative DVT study.  Patient denies headaches, difficulty breathing, chest pain.   Cough Associated symptoms: myalgias, rhinorrhea and sore throat   Associated symptoms: no chest pain, no fever and no headaches        Home Medications Prior to Admission medications   Medication Sig Start Date End Date Taking? Authorizing Provider  albuterol (VENTOLIN HFA) 108 (90 Base) MCG/ACT inhaler Inhale 1-2 puffs into the lungs every 6 (six) hours as needed for wheezing or shortness of breath. 12/12/21   Tretha Sciara, MD  Albuterol Sulfate (PROAIR HFA IN) Inhale 8.5 mg into the lungs.    [provider]  aspirin 81 MG tablet Take 81 mg by mouth daily.      [provider]  benzonatate (TESSALON) 100 MG capsule Take 1 capsule (100 mg total) by mouth every 8 (eight) hours. 12/12/21   Tretha Sciara, MD  diclofenac sodium (VOLTAREN) 1 % GEL Apply topically 4 (four) times daily.    [provider]  dutasteride (AVODART) 0.5 MG  capsule Take 0.5 mg by mouth daily.      [provider]  fluticasone (FLONASE) 50 MCG/ACT nasal spray Place 2 sprays into the nose daily.      [provider]  losartan (COZAAR) 25 MG tablet Take 25 mg by mouth daily.    [provider]  ondansetron (ZOFRAN ODT) 4 MG disintegrating tablet Take 1 tablet (4 mg total) by mouth every 8 (eight) hours as needed for nausea or vomiting. 01/14/18   Duffy Bruce, MD  predniSONE (DELTASONE) 20 MG tablet Take 2 tablets (40 mg total) by mouth daily for 5 days. 12/13/21 12/18/21  Tretha Sciara, MD  Silodosin (RAPAFLO PO) Take by mouth.      [provider]  Tiotropium Bromide Monohydrate (SPIRIVA HANDIHALER IN) Inhale into the lungs.    [provider]      Allergies    Patient has no known allergies.    Review of Systems   Review of Systems  Constitutional:  Negative for fever.  HENT:  Positive for facial swelling, rhinorrhea and sore throat.   Respiratory:  Positive for cough.   Cardiovascular:  Negative for chest pain.  Gastrointestinal:  Negative for abdominal pain, diarrhea, nausea and vomiting.  Genitourinary:  Negative for dysuria.  Musculoskeletal:  Positive for myalgias.  Neurological:  Negative for headaches.    Physical Exam Updated Vital Signs BP (!) 141/89   Pulse 70   Temp 98.6 F (37 C) (Oral)   Resp 20   Ht '5\' 6"'$  (1.676 m)  Wt 65.8 kg   SpO2 97%   BMI 23.40 kg/m  Physical Exam  ED Results / Procedures / Treatments   Labs (all labs ordered are listed, but only abnormal results are displayed) Labs Reviewed  COMPREHENSIVE METABOLIC PANEL - Abnormal; Notable for the following components:      Result Value   Potassium 3.3 (*)    Glucose, Bld 105 (*)    Calcium 8.1 (*)    Total Protein 6.4 (*)    All other components within normal limits  CBC WITH DIFFERENTIAL/PLATELET - Abnormal; Notable for the following components:   RBC 3.90 (*)    Hemoglobin 12.8 (*)    HCT 37.5  (*)    Platelets 98 (*)    All other components within normal limits  LIPASE, BLOOD    EKG None  Radiology DG Chest 2 View  Result Date: 12/12/2021 CLINICAL DATA:  Cough, fever EXAM: CHEST - 2 VIEW COMPARISON:  07/06/2021 FINDINGS: The heart size and mediastinal contours are within normal limits. Both lungs are clear. The visualized skeletal structures are unremarkable. IMPRESSION: No active cardiopulmonary disease. Electronically Signed   By: Davina Poke D.O.   On: 12/12/2021 10:36    Procedures Procedures    Medications Ordered in ED Medications  ipratropium-albuterol (DUONEB) 0.5-2.5 (3) MG/3ML nebulizer solution 3 mL (3 mLs Nebulization Given 12/12/21 1045)  methylPREDNISolone sodium succinate (SOLU-MEDROL) 125 mg/2 mL injection 125 mg (125 mg Intravenous Given 12/12/21 1033)    ED Course/ Medical Decision Making/ A&P                           Medical Decision Making Amount and/or Complexity of Data Reviewed Labs: ordered. Radiology: ordered.  Risk Prescription drug management.   Social determinants of health:  Social History   Socioeconomic History   Marital status: Divorced    Spouse name: Not on file   Number of children: Not on file   Years of education: Not on file   Highest education level: Not on file  Occupational History   Not on file  Tobacco Use   Smoking status: Former   Smokeless tobacco: Never  Vaping Use   Vaping Use: Never used  Substance and Sexual Activity   Alcohol use: Not Currently    Alcohol/week: 1.0 standard drink of alcohol    Types: 1 Glasses of wine per week   Drug use: No   Sexual activity: Not on file  Other Topics Concern   Not on file  Social History Narrative   Not on file   Social Determinants of Health   Financial Resource Strain: Not on file  Food Insecurity: Not on file  Transportation Needs: Not on file  Physical Activity: Not on file  Stress: Not on file  Social Connections: Not on file  Intimate  Partner Violence: Not on file     Initial impression:  This patient presents to the ED for concern of COVID symptoms with complicated medical history, this involves an extensive number of treatment options, and is a complaint that carries with it a high risk of complications and morbidity.   Differentials include pneumonia, COPD exacerbation, hypoxia, COVID.   Comorbidities affecting care:  Numerous per HPI  Additional history obtained: Son, oncology records  Lab Tests  I Ordered, reviewed, and interpreted labs and EKG.  The pertinent results include:  CBC, CMP unremarkable Normal lipase  Imaging Studies ordered:  I ordered imaging studies including  Chest  x-ray without acute findings I independently visualized and interpreted imaging and I agree with the radiologist interpretation.   Medicines ordered and prescription drug management:  I ordered medication including: DuoNeb Solu-Medrol 125 IV Reevaluation of the patient after these medicines showed that the patient improved I have reviewed the patients home medicines and have made adjustments as needed   ED Course/Re-evaluation: Patient is overall well-appearing and in no acute distress.  He does have frequent dry sounding cough during my examination.  Lungs diminished bilaterally without wheezes rales or rhonchi.  Throat is erythematous with midline uvula and intact airway.  Does have some epigastric abdominal tenderness to palpation.  This is likely related to the continuous coughing, however I did order abdominal labs including lipase.  He was given DuoNeb treatment and 125 mg Solu-Medrol.  Chest x-ray was without evidence of pneumonia, bronchitis or effusion.  Labs without leukocytosis or metabolic abnormality. On reassessment, patient was feeling better after the steroids and DuoNeb treatment.  We will plan to discharge home with prednisone x5 days starting tomorrow for suspected bronchitis with underlying COPD.  He was  also given albuterol inhaler refill and Tessalon Perles.  Recommend increase p.o. fluid intake and throat lozenges to help with cough and sore throat.  Return precautions discussed.  Patient and son expressed understanding are amenable to plan.  Discharge  Disposition:  After consideration of the diagnostic results, physical exam, history and the patients response to treatment feel that the patent would benefit from discharge.   COVID-19 Bronchitis: Plan and management as described above. Discharged home in good condition.  Final Clinical Impression(s) / ED Diagnoses Final diagnoses:  COVID-19  Bronchitis    Rx / DC Orders ED Discharge Orders          Ordered    predniSONE (DELTASONE) 20 MG tablet  Daily,   Status:  Discontinued        12/12/21 1154    predniSONE (DELTASONE) 20 MG tablet  Daily        12/12/21 1211    albuterol (VENTOLIN HFA) 108 (90 Base) MCG/ACT inhaler  Every 6 hours PRN,   Status:  Discontinued        12/12/21 1154    benzonatate (TESSALON) 100 MG capsule  Every 8 hours,   Status:  Discontinued        12/12/21 1154    benzonatate (TESSALON) 100 MG capsule  Every 8 hours        12/12/21 1211    albuterol (VENTOLIN HFA) 108 (90 Base) MCG/ACT inhaler  Every 6 hours PRN        12/12/21 1211              Tonye Pearson, PA-C 12/12/21 1227    Tretha Sciara, MD 12/12/21 1313

## 2021-12-12 NOTE — Discharge Instructions (Addendum)
Your labs today were very reassuring! Your xray was negative for pneumonia, so you do not need antibiotics at this time.   Today you were given a breathing treatment along with a steroid injection. I am going to send you home with oral steroids that you can start tomorrow since you got an IV dose today. This will reduce inflammation in the lungs and help breathing and coughing.   I have also sent in an inhaler and a cough medication (benzonatate). You can use both of them as needed per the prescription instructions. You can also pick up throat lozenges at the drug store to help with sore throat and cough. There are plenty of options to choose from including Ricola, Halls or other brands.  Please increase your water intake to about 2 L (minimum) per day - this is about 4 water bottles, half a gallon or 64oz. This will help with your cough, sore throat and help your body heal faster.   Continue taking your antiviral medication and regular daily medications. If you develop worsening difficulty breathing, fevers or worsening cough, please return to the ED.

## 2021-12-12 NOTE — Progress Notes (Signed)
Instructions and education provided to son for nebulizer treatment. Son then interpreted to patient. Both son and pt verbalize understanding. Treatment given without complication. Pt endorses some relief post treatment. No further needs at this time.

## 2022-06-04 ENCOUNTER — Emergency Department (HOSPITAL_BASED_OUTPATIENT_CLINIC_OR_DEPARTMENT_OTHER): Payer: Medicare HMO

## 2022-06-04 ENCOUNTER — Other Ambulatory Visit: Payer: Self-pay

## 2022-06-04 ENCOUNTER — Emergency Department (HOSPITAL_BASED_OUTPATIENT_CLINIC_OR_DEPARTMENT_OTHER)
Admission: EM | Admit: 2022-06-04 | Discharge: 2022-06-04 | Disposition: A | Discharge status: Home / Self Care | Payer: Medicare HMO | Attending: Emergency Medicine | Admitting: Emergency Medicine

## 2022-06-04 ENCOUNTER — Encounter (HOSPITAL_BASED_OUTPATIENT_CLINIC_OR_DEPARTMENT_OTHER): Payer: Self-pay | Admitting: Emergency Medicine

## 2022-06-04 DIAGNOSIS — Z79899 Other long term (current) drug therapy: Secondary | ICD-10-CM | POA: Diagnosis not present

## 2022-06-04 DIAGNOSIS — I10 Essential (primary) hypertension: Secondary | ICD-10-CM | POA: Insufficient documentation

## 2022-06-04 DIAGNOSIS — Z7982 Long term (current) use of aspirin: Secondary | ICD-10-CM | POA: Insufficient documentation

## 2022-06-04 DIAGNOSIS — R0600 Dyspnea, unspecified: Secondary | ICD-10-CM | POA: Diagnosis present

## 2022-06-04 DIAGNOSIS — Z7951 Long term (current) use of inhaled steroids: Secondary | ICD-10-CM | POA: Diagnosis not present

## 2022-06-04 DIAGNOSIS — Z20822 Contact with and (suspected) exposure to covid-19: Secondary | ICD-10-CM | POA: Diagnosis not present

## 2022-06-04 DIAGNOSIS — J441 Chronic obstructive pulmonary disease with (acute) exacerbation: Secondary | ICD-10-CM | POA: Insufficient documentation

## 2022-06-04 LAB — CBC WITH DIFFERENTIAL/PLATELET
Abs Immature Granulocytes: 0.02 10*3/uL (ref 0.00–0.07)
Basophils Absolute: 0 10*3/uL (ref 0.0–0.1)
Basophils Relative: 0 %
Eosinophils Absolute: 0.2 10*3/uL (ref 0.0–0.5)
Eosinophils Relative: 3 %
HCT: 39.7 % (ref 39.0–52.0)
Hemoglobin: 13.3 g/dL (ref 13.0–17.0)
Immature Granulocytes: 0 %
Lymphocytes Relative: 18 %
Lymphs Abs: 1.1 10*3/uL (ref 0.7–4.0)
MCH: 31.8 pg (ref 26.0–34.0)
MCHC: 33.5 g/dL (ref 30.0–36.0)
MCV: 95 fL (ref 80.0–100.0)
Monocytes Absolute: 0.5 10*3/uL (ref 0.1–1.0)
Monocytes Relative: 8 %
Neutro Abs: 4.4 10*3/uL (ref 1.7–7.7)
Neutrophils Relative %: 71 %
Platelets: 122 10*3/uL — ABNORMAL LOW (ref 150–400)
RBC: 4.18 MIL/uL — ABNORMAL LOW (ref 4.22–5.81)
RDW: 11.8 % (ref 11.5–15.5)
WBC: 6.2 10*3/uL (ref 4.0–10.5)
nRBC: 0 % (ref 0.0–0.2)

## 2022-06-04 LAB — COMPREHENSIVE METABOLIC PANEL
ALT: 31 U/L (ref 0–44)
AST: 37 U/L (ref 15–41)
Albumin: 3.5 g/dL (ref 3.5–5.0)
Alkaline Phosphatase: 69 U/L (ref 38–126)
Anion gap: 6 (ref 5–15)
BUN: 12 mg/dL (ref 8–23)
CO2: 27 mmol/L (ref 22–32)
Calcium: 8.5 mg/dL — ABNORMAL LOW (ref 8.9–10.3)
Chloride: 106 mmol/L (ref 98–111)
Creatinine, Ser: 0.88 mg/dL (ref 0.61–1.24)
GFR, Estimated: >60 mL/min (ref >60)
Glucose, Bld: 149 mg/dL — ABNORMAL HIGH (ref 70–99)
Potassium: 3.8 mmol/L (ref 3.5–5.1)
Sodium: 139 mmol/L (ref 135–145)
Total Bilirubin: 0.7 mg/dL (ref 0.3–1.2)
Total Protein: 6.3 g/dL — ABNORMAL LOW (ref 6.5–8.1)

## 2022-06-04 LAB — RESP PANEL BY RT-PCR (RSV, FLU A&B, COVID)  RVPGX2
Influenza A by PCR: NEGATIVE
Influenza B by PCR: NEGATIVE
Resp Syncytial Virus by PCR: NEGATIVE
SARS Coronavirus 2 by RT PCR: NEGATIVE

## 2022-06-04 LAB — LACTIC ACID, PLASMA: Lactic Acid, Venous: 1.6 mmol/L (ref 0.5–1.9)

## 2022-06-04 MED ORDER — METHYLPREDNISOLONE SODIUM SUCC 125 MG IJ SOLR
125.0000 mg | Freq: Once | INTRAMUSCULAR | Status: AC
  Administered 2022-06-04: 125 mg via INTRAVENOUS
  Filled 2022-06-04: qty 2

## 2022-06-04 MED ORDER — IOHEXOL 350 MG/ML SOLN
75.0000 mL | Freq: Once | INTRAVENOUS | Status: AC | PRN
  Administered 2022-06-04: 75 mL via INTRAVENOUS

## 2022-06-04 MED ORDER — ALBUTEROL SULFATE HFA 108 (90 BASE) MCG/ACT IN AERS
2.0000 | INHALATION_SPRAY | RESPIRATORY_TRACT | Status: DC | PRN
  Administered 2022-06-04: 2 via RESPIRATORY_TRACT
  Filled 2022-06-04: qty 6.7

## 2022-06-04 MED ORDER — ALBUTEROL SULFATE (2.5 MG/3ML) 0.083% IN NEBU
5.0000 mg | INHALATION_SOLUTION | Freq: Once | RESPIRATORY_TRACT | Status: AC
  Administered 2022-06-04: 5 mg via RESPIRATORY_TRACT
  Filled 2022-06-04: qty 6

## 2022-06-04 MED ORDER — AEROCHAMBER PLUS FLO-VU MEDIUM MISC
1.0000 | Freq: Once | Status: AC
  Administered 2022-06-04: 1
  Filled 2022-06-04: qty 10

## 2022-06-04 MED ORDER — PREDNISONE 10 MG PO TABS: 20.0000 mg | ORAL_TABLET | Freq: Every day | ORAL | 0 refills | Status: AC

## 2022-06-04 NOTE — ED Provider Notes (Signed)
[facility] Provider Note   CSN: [id number] Arrival date & time: 06/04/22  1033     History  Chief Complaint  Patient presents with   Cough    Theodore Hess is a 77 y.o. male.  HPI Level 5 caveat  History obtained through translator 77 year old male presents today complaining of cough, for the past week but has been worsening tonight and into today.  He has some mild dyspnea.  He has not had fever, chills, nausea, vomiting, or diarrhea.  He has a distant history of smoking.    Home Medications Prior to Admission medications   Medication Sig Start Date End Date Taking? Authorizing Provider  albuterol (VENTOLIN HFA) 108 (90 Base) MCG/ACT inhaler Inhale 1-2 puffs into the lungs every 6 (six) hours as needed for wheezing or shortness of breath. 12/12/21  Yes Tretha Sciara, MD  Albuterol Sulfate (PROAIR HFA IN) Inhale 8.5 mg into the lungs.   Yes [provider]  ammonium lactate (LAC-HYDRIN) 12 % lotion Apply 1 Application topically as needed for dry skin.   Yes [provider]  aspirin 81 MG tablet Take 81 mg by mouth daily.     Yes [provider]  atorvastatin (LIPITOR) 20 MG tablet Take 20 mg by mouth daily.   Yes [provider]  diclofenac sodium (VOLTAREN) 1 % GEL Apply topically 4 (four) times daily.   Yes [provider]  dutasteride (AVODART) 0.5 MG capsule Take 0.5 mg by mouth daily.     Yes [provider]  fluticasone (FLONASE) 50 MCG/ACT nasal spray Place 2 sprays into the nose daily.     Yes [provider]  Fluticasone-Umeclidin-Vilant (TRELEGY ELLIPTA) 100-62.5-25 MCG/ACT AEPB Inhale into the lungs.   Yes [provider]  hydrochlorothiazide (HYDRODIURIL) 25 MG tablet Take 25 mg by mouth daily.   Yes [provider]  imatinib (GLEEVEC) 100 MG tablet Take 100 mg by mouth 2 (two) times daily. Take with meals and large glass of  water.Caution:Chemotherapy   Yes [provider]  losartan (COZAAR) 25 MG tablet Take 25 mg by mouth daily.   Yes [provider]  pantoprazole (PROTONIX) 40 MG tablet Take 40 mg by mouth daily.   Yes [provider]  potassium chloride (KLOR-CON) 10 MEQ tablet Take 10 mEq by mouth daily.   Yes [provider]  predniSONE (DELTASONE) 10 MG tablet Take 2 tablets (20 mg total) by mouth daily. 06/04/22  Yes Pattricia Boss, MD  prochlorperazine (COMPAZINE) 10 MG tablet Take 10 mg by mouth every 6 (six) hours as needed for nausea or vomiting.   Yes [provider]  Tiotropium Bromide Monohydrate (SPIRIVA HANDIHALER IN) Inhale into the lungs.   Yes [provider]  benzonatate (TESSALON) 100 MG capsule Take 1 capsule (100 mg total) by mouth every 8 (eight) hours. 12/12/21   Tretha Sciara, MD  ondansetron (ZOFRAN ODT) 4 MG disintegrating tablet Take 1 tablet (4 mg total) by mouth every 8 (eight) hours as needed for nausea or vomiting. 01/14/18   Duffy Bruce, MD  Silodosin (RAPAFLO PO) Take by mouth.      [provider]      Allergies    Patient has no known allergies.    Review of Systems   Review of Systems  Physical Exam Updated Vital Signs BP (!) 150/99   Pulse 81   Temp 98.5 F (36.9 C) (Rectal)   Resp 15  Wt 70.6 kg   SpO2 97%   BMI 25.12 kg/m  Physical Exam Vitals and nursing note reviewed.  Constitutional:      Appearance: Normal appearance.  HENT:     Head: Normocephalic.     Right Ear: External ear normal.     Left Ear: External ear normal.     Nose: Nose normal.     Mouth/Throat:     Pharynx: Oropharynx is clear.  Eyes:     Pupils: Pupils are equal, round, and reactive to light.  Cardiovascular:     Rate and Rhythm: Normal rate and regular rhythm.     Pulses: Normal pulses.     Heart sounds: Normal heart sounds.  Pulmonary:     Effort: Respiratory distress present.     Breath sounds: Wheezing  present.  Abdominal:     General: Bowel sounds are normal.  Musculoskeletal:        General: Normal range of motion.     Cervical back: Normal range of motion.  Skin:    General: Skin is warm.     Capillary Refill: Capillary refill takes less than 2 seconds.  Neurological:     General: No focal deficit present.     Mental Status: He is alert.  Psychiatric:        Mood and Affect: Mood normal.     ED Results / Procedures / Treatments   Labs (all labs ordered are listed, but only abnormal results are displayed) Labs Reviewed  CBC WITH DIFFERENTIAL/PLATELET - Abnormal; Notable for the following components:      Result Value   RBC 4.18 (*)    Platelets 122 (*)    All other components within normal limits  COMPREHENSIVE METABOLIC PANEL - Abnormal; Notable for the following components:   Glucose, Bld 149 (*)    Calcium 8.5 (*)    Total Protein 6.3 (*)    All other components within normal limits  RESP PANEL BY RT-PCR (RSV, FLU A&B, COVID)  [id number]  LACTIC ACID, PLASMA  LACTIC ACID, PLASMA    EKG EKG Interpretation  Date/Time:  Saturday June 04 2022 13:08:37 EST Ventricular Rate:  78 PR Interval:  212 QRS Duration: 84 QT Interval:  380 QTC Calculation: 433 R Axis:   75 Text Interpretation: Sinus rhythm Borderline prolonged PR interval Confirmed by Pattricia Boss [id number]) on 06/04/2022 1:14:13 PM  Radiology CT Angio Chest PE W and/or Wo Contrast  Result Date: 06/04/2022 CLINICAL DATA:  Pleural effusion, productive cough for 1 week, metastatic GIST  EXAM: CT ANGIOGRAPHY CHEST WITH CONTRAST TECHNIQUE: Multidetector CT imaging of the chest was performed using the standard protocol during bolus administration of intravenous contrast. Multiplanar CT image reconstructions and MIPs were obtained to evaluate the vascular anatomy. RADIATION DOSE REDUCTION: This exam was performed according to the departmental dose-optimization program which includes automated exposure control, adjustment  of the mA and/or kV according to patient size and/or use of iterative reconstruction technique. CONTRAST:  11m OMNIPAQUE IOHEXOL 350 MG/ML SOLN COMPARISON:  CT chest abdomen pelvis, 05/03/2022 FINDINGS: Cardiovascular: Satisfactory opacification of the pulmonary arteries to the segmental level. No evidence of pulmonary embolism. Cardiomegaly. Left and right coronary artery calcifications. Unchanged trace pericardial effusion. Aortic atherosclerosis. Unchanged enlargement of the tubular ascending thoracic aorta measuring up to 4.5 x 4.3 cm. Aortic valve calcifications. Mediastinum/Nodes: Unchanged prominent right hilar lymph node (series 6, image 43). No other enlarged mediastinal, hilar, or axillary lymph nodes. Thyroid gland, trachea, and esophagus demonstrate no significant findings.  Lungs/Pleura: Mild emphysema. Unchanged 0.6 cm nodule of the peripheral posterior right upper lobe (series 7, image 42). No pleural effusion or pneumothorax. Upper Abdomen: No acute abnormality. Hypodense hepatic metastasis, better characterized by recent prior staging examination of the chest abdomen pelvis (series 6, image 80). Musculoskeletal: No chest wall abnormality. No acute osseous findings. Review of the MIP images confirms the above findings. IMPRESSION: 1. Negative examination for pulmonary embolism. 2. Unchanged 0.6 cm nodule of the peripheral posterior right upper lobe. 3. Unchanged prominent right hilar lymph node. 4. Hypodense hepatic metastasis, better characterized by recent prior staging examination of the chest abdomen pelvis. 5. Emphysema. 6. Coronary artery disease. 7. Unchanged enlargement of the tubular ascending thoracic aorta measuring up to 4.5 x 4.3 cm. Ascending thoracic aortic aneurysm. Recommend semi-annual imaging followup by CTA or MRA and referral to cardiothoracic surgery if not already obtained, if not already obtained and clinically appropriate. This recommendation follows 2010  ACCF/AHA/AATS/ACR/ASA/SCA/SCAI/SIR/STS/SVM Guidelines for the Diagnosis and Management of Patients With Thoracic Aortic Disease. Circulation. 2010; [id number]. Aortic aneurysm NOS (ICD10-I71.9) Aortic Atherosclerosis (ICD10-I70.0) and Emphysema (ICD10-J43.9). Electronically Signed   By: Delanna Ahmadi M.D.   On: 06/04/2022 13:45   DG Chest 2 View  Result Date: 06/04/2022 CLINICAL DATA:  Cough. EXAM: CHEST - 2 VIEW COMPARISON:  12/12/2021 FINDINGS: The lungs are clear without focal pneumonia, edema, pneumothorax or pleural effusion. Interstitial markings are diffusely coarsened with chronic features. Small pulmonary nodule identified right lung base, superimposed on the posterior eighth rib. Bones are diffusely demineralized. IMPRESSION: 1. No acute cardiopulmonary findings. 2. Small pulmonary nodule right lung base. CT chest without contrast recommended to further evaluate. Electronically Signed   By: Misty Stanley M.D.   On: 06/04/2022 11:33   From oncology notes:  Theodore Hess is a past medical history significant for COPD, pancreatic cysts, hypertension, and kidney stones who presented to [facility] emergency department on 02/08/2021 for further work-up and evaluation of hematuria. According to the medical record, he had had worsening intermittent hematuria despite a course of antibiotics. He had seen urology in October and was reportedly scheduled for a CT and cystoscopy in December.  As part of the evaluation, CT renal stone study was performed. A 7 mm nonobstructing right lower pole renal calculus was seen. No hydronephrosis was noted. An additional 2 mm layering calculus was noted within a TURP defect. In addition, 2 hepatic masses measuring up to 4.8 cm in the right hepatic dome, new, suspicious for metastasis were noted. A primary lesion was not evident on the study.  He was referred to gastroenterology and saw Dr. Marin Comment on 03/16/2021. According to their documentation, he had undergone an  MRCP without contrast on 08/29/2020 which noted 2 pancreatic cysts measuring up to 13 mm, grossly unchanged dating back to 2014, likely benign. GI recommended initially MRI of the liver but ultimately, CT of the abdomen and pelvis with and without contrast was performed on 03/23/2021. This revealed hypoenhancing extraluminal mass about the gastric fundus measuring 5.1 x 3.7 cm, previously 4.1 x 2.6 when compared to the CT stone study dated 02/08/2021. Heterogeneously hypoenhancing lesion of the anterior liver dome, hepatic segment 8, measuring 5.8 x 5.2, enlarged in comparison to prior unenhanced CT at that time it measured 4.8 x 4.8 cm. Additional heterogeneously hypoenhancing lesion in the inferior right lobe of the liver hepatic segment 6 measuring 3.2 x 2.4, previously 2.0 x 1.4. Additional small low-attenuation lesions in the left and right lobe of the liver  were subcentimeter and incompletely characterized although suspicious for additional small metastases.  He underwent EGD on 04/08/2021. Diffuse white exudates were identified in the gastric body suggestive of Candida esophagitis. Brushings were performed. There was no stricture identified. Gastric exam was significant for a 4 cm submucosal mass lesion with a focal ulcerated area. The mass was firm and friable. Biopsies were obtained. The duodenal bulb and second part of the duodenum appeared normal.   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  He presents today due to dyspnea.  He has had cough for the past week that has worsened over the past 48 hours. On presentation patient had diffuse wheezing.  He had been using an inhaler increasingly over the past 48 hours. Here in the ED he was treated with Solu-Medrol and albuterol. His respiratory rate has decreased and oxygen saturations have remained stable on room air Flu, COVID, and RSV are negative Chest x-Emmalia Heyboer was obtained that did not  show any evidence of acute abnormality.  Imaging of his chest shows no evidence of pulmonary embolism, unchanged nodules of the right upper lobe, unchanged prominent right hilar lymph node, hypodense hepatic metastases that have been previously characterized on his abdomen and pelvis, emphysema, coronary artery disease, and unchanged enlargement of the tubular ascending thoracic aorta. Patient symptomatically has improved Patient is given albuterol inhaler with spacer.  He will be placed on 5 days of prednisone We have discussed return precautions and need for follow-up His son is present with him and voices understanding of plan Procedures    Medications Ordered in ED Medications  albuterol (VENTOLIN HFA) 108 (90 Base) MCG/ACT inhaler 2 puff (2 puffs Inhalation Given 06/04/22 1508)  methylPREDNISolone sodium succinate (SOLU-MEDROL) 125 mg/2 mL injection 125 mg (125 mg Intravenous Given 06/04/22 1143)  albuterol (PROVENTIL) (2.5 MG/3ML) 0.083% nebulizer solution 5 mg (5 mg Nebulization Given 06/04/22 1152)  iohexol (OMNIPAQUE) 350 MG/ML injection 75 mL (75 mLs Intravenous Contrast Given 06/04/22 1313)  AeroChamber Plus Flo-Vu Medium MISC 1 each (1 each Other Given 06/04/22 1508)    ED Course/ Medical Decision Making/ A&P Clinical Course as of 06/04/22 1543  Sat Jun 04, 2022  1256 CBC reviewed interpreted significant for thrombocytopenia platelets 122,000 which are increased from first prior Complete metabolic panel is returned, reviewed, interpreted, hyperglycemia with glucose of 149 and mild hypocalcemia with at 8.5 [DR]  [place], flu, RSV negative [DR]  1256 Dust x-Dietrick Barris without any evidence of pneumothorax or infiltrate [DR]    Clinical Course User Index [DR] Pattricia Boss, MD                             Medical Decision Making Amount and/or Complexity of Data Reviewed Labs: ordered. Radiology: ordered.  Risk Prescription drug management.          Final Clinical Impression(s)  / ED Diagnoses Final diagnoses:  COPD exacerbation ([place])    Rx / DC Orders ED Discharge Orders          Ordered    predniSONE (DELTASONE) 10 MG tablet  Daily        06/04/22 1542              Pattricia Boss, MD 06/04/22 1544

## 2022-06-04 NOTE — ED Triage Notes (Signed)
Cols symptoms x 1 week , productive cough , worse today . Denies shortness of breath or chest pain .

## 2022-06-04 NOTE — Discharge Instructions (Signed)
Please use 2 puffs of your inhaler with the spacer in place up to every 3-4 hours Take prednisone as prescribed Return if you are having any worsening shortness of breath or difficulty breathing Call your doctor on Monday for recheck

## 2023-01-03 ENCOUNTER — Encounter (HOSPITAL_BASED_OUTPATIENT_CLINIC_OR_DEPARTMENT_OTHER): Payer: Self-pay | Admitting: Emergency Medicine

## 2023-01-03 ENCOUNTER — Other Ambulatory Visit: Payer: Self-pay

## 2023-01-03 ENCOUNTER — Emergency Department (HOSPITAL_BASED_OUTPATIENT_CLINIC_OR_DEPARTMENT_OTHER)
Admission: EM | Admit: 2023-01-03 | Discharge: 2023-01-03 | Disposition: A | Discharge status: Home / Self Care | Payer: Medicare HMO | Attending: Emergency Medicine | Admitting: Emergency Medicine

## 2023-01-03 ENCOUNTER — Emergency Department (HOSPITAL_BASED_OUTPATIENT_CLINIC_OR_DEPARTMENT_OTHER): Payer: Medicare HMO

## 2023-01-03 DIAGNOSIS — U071 COVID-19: Secondary | ICD-10-CM | POA: Diagnosis not present

## 2023-01-03 DIAGNOSIS — J449 Chronic obstructive pulmonary disease, unspecified: Secondary | ICD-10-CM | POA: Diagnosis not present

## 2023-01-03 DIAGNOSIS — Z7951 Long term (current) use of inhaled steroids: Secondary | ICD-10-CM | POA: Insufficient documentation

## 2023-01-03 DIAGNOSIS — R059 Cough, unspecified: Secondary | ICD-10-CM | POA: Diagnosis present

## 2023-01-03 DIAGNOSIS — Z7952 Long term (current) use of systemic steroids: Secondary | ICD-10-CM | POA: Insufficient documentation

## 2023-01-03 DIAGNOSIS — Z7982 Long term (current) use of aspirin: Secondary | ICD-10-CM | POA: Insufficient documentation

## 2023-01-03 MED ORDER — ALBUTEROL SULFATE HFA 108 (90 BASE) MCG/ACT IN AERS
2.0000 | INHALATION_SPRAY | Freq: Once | RESPIRATORY_TRACT | Status: AC
  Administered 2023-01-03: 2 via RESPIRATORY_TRACT
  Filled 2023-01-03: qty 6.7

## 2023-01-03 MED ORDER — DEXAMETHASONE 4 MG PO TABS
10.0000 mg | ORAL_TABLET | Freq: Once | ORAL | Status: AC
  Administered 2023-01-03: 10 mg via ORAL
  Filled 2023-01-03: qty 3

## 2023-01-03 NOTE — ED Triage Notes (Signed)
Covid positive 2 days ago , persistent cough . No fever or shortness of breath . He reports not getting any better despite his meds . No chest pain . Ambulatory to triage .  He is worry due to his cancer and being immune compromised

## 2023-01-03 NOTE — ED Provider Notes (Signed)
[place] [facility] Provider Note   CSN: [id number] Arrival date & time: 01/03/23  1129     History  Chief Complaint  Patient presents with   Cough    Theodore Hess is a 77 y.o. male.  77 y.o. male containing with cough secondary to known COVID infection.  Past medical history significant for GIST tumor currently being treated by oncology and COPD.  He was brought in today by his son who he elects to have translate for him for HPI.  He reports that his dad has been coughing continuously since onset of COVID.  He has been taking his antivirals at home and cough medicine Tessalon Perles that were prescribed without significant symptom relief.  They are concerned due to his cancer that having the virus may worsen his symptoms.  He denies significant shortness of breath or increased work of breathing.  Endorses rhinorrhea, congestion, coughing up mucus, sore throat.     Cough Associated symptoms: rhinorrhea and sore throat   Associated symptoms: no chest pain, no chills, no diaphoresis, no ear pain, no rash, no shortness of breath and no wheezing        Home Medications Prior to Admission medications   Medication Sig Start Date End Date Taking? Authorizing Provider  albuterol (VENTOLIN HFA) 108 (90 Base) MCG/ACT inhaler Inhale 1-2 puffs into the lungs every 6 (six) hours as needed for wheezing or shortness of breath. 12/12/21   Glyn Ade, MD  Albuterol Sulfate (PROAIR HFA IN) Inhale 8.5 mg into the lungs.    [provider]  ammonium lactate (LAC-HYDRIN) 12 % lotion Apply 1 Application topically as needed for dry skin.    [provider]  aspirin 81 MG tablet Take 81 mg by mouth daily.      [provider]  atorvastatin (LIPITOR) 20 MG tablet Take 20 mg by mouth daily.    [provider]  benzonatate (TESSALON) 100 MG capsule Take 1 capsule (100 mg total) by mouth every 8 (eight) hours. 12/12/21    Glyn Ade, MD  diclofenac sodium (VOLTAREN) 1 % GEL Apply topically 4 (four) times daily.    [provider]  dutasteride (AVODART) 0.5 MG capsule Take 0.5 mg by mouth daily.      [provider]  fluticasone (FLONASE) 50 MCG/ACT nasal spray Place 2 sprays into the nose daily.      [provider]  Fluticasone-Umeclidin-Vilant (TRELEGY ELLIPTA) 100-62.5-25 MCG/ACT AEPB Inhale into the lungs.    [provider]  hydrochlorothiazide (HYDRODIURIL) 25 MG tablet Take 25 mg by mouth daily.    [provider]  imatinib (GLEEVEC) 100 MG tablet Take 100 mg by mouth 2 (two) times daily. Take with meals and large glass of water.Caution:Chemotherapy    [provider]  losartan (COZAAR) 25 MG tablet Take 25 mg by mouth daily.    [provider]  ondansetron (ZOFRAN ODT) 4 MG disintegrating tablet Take 1 tablet (4 mg total) by mouth every 8 (eight) hours as needed for nausea or vomiting. 01/14/18   Shaune Pollack, MD  pantoprazole (PROTONIX) 40 MG tablet Take 40 mg by mouth daily.    [provider]  potassium chloride (KLOR-CON) 10 MEQ tablet Take 10 mEq by mouth daily.    [provider]  predniSONE (DELTASONE) 10 MG tablet Take 2 tablets (20 mg total) by mouth daily. 06/04/22   Margarita Grizzle, MD  prochlorperazine (COMPAZINE) 10 MG tablet Take 10 mg  by mouth every 6 (six) hours as needed for nausea or vomiting.    [provider]  Silodosin (RAPAFLO PO) Take by mouth.      [provider]  Tiotropium Bromide Monohydrate (SPIRIVA HANDIHALER IN) Inhale into the lungs.    [provider]      Allergies    Patient has no known allergies.    Review of Systems   Review of Systems  Constitutional:  Negative for activity change, appetite change, chills, diaphoresis and unexpected weight change.  HENT:  Positive for congestion, postnasal drip, rhinorrhea, sinus pressure and sore throat. Negative for  ear pain and trouble swallowing.   Respiratory:  Positive for cough. Negative for apnea, chest tightness, shortness of breath, wheezing and stridor.   Cardiovascular:  Negative for chest pain.  Gastrointestinal:  Positive for diarrhea. Negative for abdominal distention and blood in stool.  Skin:  Negative for color change, pallor and rash.  Neurological:  Negative for dizziness, seizures, syncope and light-headedness.    Physical Exam Updated Vital Signs BP (!) 142/105   Pulse 72   Temp 98.2 F (36.8 C) (Oral)   Resp 20   Wt 69.9 kg   SpO2 98%   BMI 24.86 kg/m  Physical Exam Constitutional:      General: He is not in acute distress.    Appearance: Normal appearance. He is normal weight. He is not ill-appearing.  HENT:     Nose: Congestion and rhinorrhea present.     Mouth/Throat:     Mouth: Mucous membranes are moist.     Pharynx: Posterior oropharyngeal erythema present.  Eyes:     Extraocular Movements: Extraocular movements intact.     Conjunctiva/sclera: Conjunctivae normal.     Pupils: Pupils are equal, round, and reactive to light.  Cardiovascular:     Rate and Rhythm: Normal rate and regular rhythm.     Pulses: Normal pulses.  Pulmonary:     Effort: Pulmonary effort is normal. No respiratory distress.     Breath sounds: Normal breath sounds. No wheezing.  Abdominal:     General: Abdomen is flat. Bowel sounds are normal.     Palpations: Abdomen is soft.  Musculoskeletal:     Cervical back: Normal range of motion.  Skin:    General: Skin is warm.     Capillary Refill: Capillary refill takes less than 2 seconds.     Coloration: Skin is not pale.     Findings: No erythema or rash.  Neurological:     General: No focal deficit present.     Mental Status: He is alert and oriented to person, place, and time. Mental status is at baseline.     Cranial Nerves: No cranial nerve deficit.     Motor: No weakness.  Psychiatric:        Mood and Affect: Mood normal.         Behavior: Behavior normal.        Thought Content: Thought content normal.        Judgment: Judgment normal.     ED Results / Procedures / Treatments   Labs (all labs ordered are listed, but only abnormal results are displayed) Labs Reviewed - No data to display  EKG None  Radiology No results found.  Procedures Procedures    Medications Ordered in ED Medications  dexamethasone (DECADRON) tablet 10 mg (has no administration in time range)  albuterol (VENTOLIN HFA) 108 (90 Base) MCG/ACT inhaler 2 puff (has no administration  in time range)    ED Course/ Medical Decision Making/ A&P                                 Medical Decision Making 77 y.o. male presenting with chronic cough secondary to known COVID infection.  On arrival vital signs stable without hypoxia, no fever and elevated blood pressure to 142/105.  On exam he is well-appearing with persistent nonproductive cough present no wheezing or consolidations appreciated on exam.  Differential includes viral infection, pneumonia, COPD exacerbation secondary to viral infection.  Chest x-ray obtained and on independent review clear costophrenic angles without pulmonary effusion, no consolidations concerning for pneumonia, good inspiratory effort.   Suspect this is viral infection secondary to COVID.  Low suspicion for pneumonia with benign clinical exam and normal CXR.  Shared decision making with family to continue conservative therapies with focus of treating symptoms.  Instructed patient to use honey as needed for cough and sore throat and to continue remdesivir as prescribed.  Also prescribed albuterol inhaler and patient received Decadron prior to discharge home.  Instructed patient to follow-up with PCP as needed outpatient for continued fever or increased work of breathing.  Amount and/or Complexity of Data Reviewed Independent Historian: caregiver    Details: Translated by son External Data Reviewed: labs, radiology and  notes. Radiology: ordered and independent interpretation performed.          Final Clinical Impression(s) / ED Diagnoses Final diagnoses:  COVID-19    Rx / DC Orders ED Discharge Orders     None         Glendale Chard, DO 01/03/23 1426    Virgina Norfolk, DO 01/03/23 1451

## 2023-01-03 NOTE — Discharge Instructions (Addendum)
You have been treated with a long-acting steroid for your cough.  Use inhaler 2 puffs every 4 hours as needed.  Follow-up with your primary care doctor.  Return here if symptoms worsen.  If you have any fever lasting more than 5 or 6 days recommend reevaluation.  Continue to follow with oncology regarding pulmonary nodule.

## 2023-01-03 NOTE — ED Notes (Signed)
Pt ambulatory from triage 

## 2023-03-05 ENCOUNTER — Encounter (HOSPITAL_BASED_OUTPATIENT_CLINIC_OR_DEPARTMENT_OTHER): Payer: Self-pay

## 2023-03-05 ENCOUNTER — Other Ambulatory Visit: Payer: Self-pay

## 2023-03-05 ENCOUNTER — Emergency Department (HOSPITAL_BASED_OUTPATIENT_CLINIC_OR_DEPARTMENT_OTHER)
Admission: EM | Admit: 2023-03-05 | Discharge: 2023-03-05 | Disposition: A | Discharge status: Home / Self Care | Payer: Medicare HMO | Attending: Emergency Medicine | Admitting: Emergency Medicine

## 2023-03-05 ENCOUNTER — Emergency Department (HOSPITAL_BASED_OUTPATIENT_CLINIC_OR_DEPARTMENT_OTHER): Payer: Medicare HMO

## 2023-03-05 DIAGNOSIS — Z20822 Contact with and (suspected) exposure to covid-19: Secondary | ICD-10-CM | POA: Insufficient documentation

## 2023-03-05 DIAGNOSIS — R051 Acute cough: Secondary | ICD-10-CM | POA: Insufficient documentation

## 2023-03-05 DIAGNOSIS — R0602 Shortness of breath: Secondary | ICD-10-CM | POA: Insufficient documentation

## 2023-03-05 DIAGNOSIS — R630 Anorexia: Secondary | ICD-10-CM | POA: Diagnosis not present

## 2023-03-05 DIAGNOSIS — R112 Nausea with vomiting, unspecified: Secondary | ICD-10-CM | POA: Diagnosis present

## 2023-03-05 DIAGNOSIS — I10 Essential (primary) hypertension: Secondary | ICD-10-CM | POA: Insufficient documentation

## 2023-03-05 DIAGNOSIS — Z7982 Long term (current) use of aspirin: Secondary | ICD-10-CM | POA: Diagnosis not present

## 2023-03-05 DIAGNOSIS — R531 Weakness: Secondary | ICD-10-CM | POA: Insufficient documentation

## 2023-03-05 DIAGNOSIS — Z79899 Other long term (current) drug therapy: Secondary | ICD-10-CM | POA: Diagnosis not present

## 2023-03-05 DIAGNOSIS — R197 Diarrhea, unspecified: Secondary | ICD-10-CM | POA: Insufficient documentation

## 2023-03-05 LAB — HEPATIC FUNCTION PANEL
ALT: 24 U/L (ref 0–44)
AST: 27 U/L (ref 15–41)
Albumin: 3.6 g/dL (ref 3.5–5.0)
Alkaline Phosphatase: 63 U/L (ref 38–126)
Bilirubin, Direct: 0.2 mg/dL (ref 0.0–0.2)
Indirect Bilirubin: 1.2 mg/dL — ABNORMAL HIGH (ref 0.3–0.9)
Total Bilirubin: 1.4 mg/dL — ABNORMAL HIGH (ref <1.2)
Total Protein: 6.6 g/dL (ref 6.5–8.1)

## 2023-03-05 LAB — CBC WITH DIFFERENTIAL/PLATELET
Abs Immature Granulocytes: 0.01 10*3/uL (ref 0.00–0.07)
Basophils Absolute: 0 10*3/uL (ref 0.0–0.1)
Basophils Relative: 0 %
Eosinophils Absolute: 0.1 10*3/uL (ref 0.0–0.5)
Eosinophils Relative: 1 %
HCT: 41.9 % (ref 39.0–52.0)
Hemoglobin: 14.2 g/dL (ref 13.0–17.0)
Immature Granulocytes: 0 %
Lymphocytes Relative: 21 %
Lymphs Abs: 1.5 10*3/uL (ref 0.7–4.0)
MCH: 31.8 pg (ref 26.0–34.0)
MCHC: 33.9 g/dL (ref 30.0–36.0)
MCV: 93.7 fL (ref 80.0–100.0)
Monocytes Absolute: 0.6 10*3/uL (ref 0.1–1.0)
Monocytes Relative: 8 %
Neutro Abs: 4.9 10*3/uL (ref 1.7–7.7)
Neutrophils Relative %: 70 %
Platelets: 123 10*3/uL — ABNORMAL LOW (ref 150–400)
RBC: 4.47 MIL/uL (ref 4.22–5.81)
RDW: 11.9 % (ref 11.5–15.5)
WBC: 7 10*3/uL (ref 4.0–10.5)
nRBC: 0 % (ref 0.0–0.2)

## 2023-03-05 LAB — URINALYSIS, ROUTINE W REFLEX MICROSCOPIC
Bilirubin Urine: NEGATIVE
Glucose, UA: 100 mg/dL — AB
Ketones, ur: NEGATIVE mg/dL
Leukocytes,Ua: NEGATIVE
Nitrite: NEGATIVE
Protein, ur: 30 mg/dL — AB
Specific Gravity, Urine: 1.02 (ref 1.005–1.030)
pH: 7 (ref 5.0–8.0)

## 2023-03-05 LAB — I-STAT CHEM 8, ED
BUN: 10 mg/dL (ref 8–23)
Calcium, Ion: 1.14 mmol/L — ABNORMAL LOW (ref 1.15–1.40)
Chloride: 104 mmol/L (ref 98–111)
Creatinine, Ser: 0.8 mg/dL (ref 0.61–1.24)
Glucose, Bld: 116 mg/dL — ABNORMAL HIGH (ref 70–99)
HCT: 43 % (ref 39.0–52.0)
Hemoglobin: 14.6 g/dL (ref 13.0–17.0)
Potassium: 3.6 mmol/L (ref 3.5–5.1)
Sodium: 139 mmol/L (ref 135–145)
TCO2: 24 mmol/L (ref 22–32)

## 2023-03-05 LAB — RESP PANEL BY RT-PCR (RSV, FLU A&B, COVID)  RVPGX2
Influenza A by PCR: NEGATIVE
Influenza B by PCR: NEGATIVE
Resp Syncytial Virus by PCR: NEGATIVE
SARS Coronavirus 2 by RT PCR: NEGATIVE

## 2023-03-05 LAB — URINALYSIS, MICROSCOPIC (REFLEX)

## 2023-03-05 LAB — LIPASE, BLOOD: Lipase: 27 U/L (ref 11–51)

## 2023-03-05 MED ORDER — ONDANSETRON 4 MG PO TBDP: 4.0000 mg | ORAL_TABLET | Freq: Three times a day (TID) | ORAL | 0 refills | Status: AC | PRN

## 2023-03-05 MED ORDER — BENZONATATE 100 MG PO CAPS: 100.0000 mg | ORAL_CAPSULE | Freq: Three times a day (TID) | ORAL | 0 refills | Status: AC

## 2023-03-05 MED ORDER — ONDANSETRON HCL 4 MG/2ML IJ SOLN
4.0000 mg | Freq: Once | INTRAMUSCULAR | Status: DC
  Filled 2023-03-05: qty 2

## 2023-03-05 NOTE — ED Notes (Signed)
Pt given gatorade for fluid challenge per EDP verbal order

## 2023-03-05 NOTE — Discharge Instructions (Addendum)
It was a pleasure taking care of you today.  As discussed, your labs are reassuring.  Your chest x-ray did not show evidence of pneumonia.  Your COVID and flu test were negative.  I am sending you home with cough medication.  Take as needed for cough.  I am also sending you home with nausea medication.  Take as needed.  Please follow-up with PCP in 2 to 3 days for recheck.

## 2023-03-05 NOTE — ED Provider Notes (Signed)
[place] [facility] Provider Note   CSN: [id number] Arrival date & time: 03/05/23  1059     History  Chief Complaint  Patient presents with   Cough    Theodore Hess is a 77 y.o. 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  Patient also developed nausea, vomiting, diarrhea ever since starting antibiotics 6 days ago.  Son at bedside provided history.  Son notes that patient was seen at urgent care on 11/24 where his symptoms were thought to be related to a URI.  He was discharged with antibiotics, cough medication, and an inhaler.  He started the antibiotics 6 days ago and developed diarrhea which prompted him to stop the same day.  He was then seen by PCP 5 days ago where he was given 2 IM injections which son believes were steroids and an antibiotic.  Since then patient has had nausea, vomiting, and diarrhea.  Son at bedside notes that patient has had decreased p.o. intake.  Also admits to some generalized weakness.  No chest pain.  Admits to some shortness of breath.  No fever however, admits to chills.  Denies associated abdominal pain.  History obtained from patient and past medical records. No interpreter used during encounter.       Home Medications Prior to Admission medications   Medication Sig Start Date End Date Taking? Authorizing Provider  benzonatate (TESSALON) 100 MG capsule Take 1 capsule (100 mg total) by mouth every 8 (eight) hours. 03/05/23  Yes Kyuss Hale C, PA-C  ondansetron (ZOFRAN-ODT) 4 MG disintegrating tablet Take 1 tablet (4 mg total) by mouth every 8 (eight) hours as needed for nausea or vomiting. 03/05/23  Yes Margorie Renner, Merla Riches, PA-C  albuterol (VENTOLIN HFA) 108 (90 Base) MCG/ACT inhaler Inhale 1-2 puffs into the lungs every 6 (six) hours as needed for wheezing or shortness of breath. 12/12/21    Glyn Ade, MD  Albuterol Sulfate (PROAIR HFA IN) Inhale 8.5 mg into the lungs.    [provider]  ammonium lactate (LAC-HYDRIN) 12 % lotion Apply 1 Application topically as needed for dry skin.    [provider]  aspirin 81 MG tablet Take 81 mg by mouth daily.      [provider]  atorvastatin (LIPITOR) 20 MG tablet Take 20 mg by mouth daily.    [provider]  benzonatate (TESSALON) 100 MG capsule Take 1 capsule (100 mg total) by mouth every 8 (eight) hours. 12/12/21   Glyn Ade, MD  diclofenac sodium (VOLTAREN) 1 % GEL Apply topically 4 (four) times daily.    [provider]  dutasteride (AVODART) 0.5 MG capsule Take 0.5 mg by mouth daily.      [provider]  fluticasone (FLONASE) 50 MCG/ACT nasal spray Place 2 sprays into the nose daily.      [provider]  Fluticasone-Umeclidin-Vilant (TRELEGY ELLIPTA) 100-62.5-25 MCG/ACT AEPB Inhale into the lungs.    [provider]  hydrochlorothiazide (HYDRODIURIL) 25 MG tablet Take 25 mg by mouth daily.    [provider]  imatinib (GLEEVEC) 100 MG tablet Take 100 mg by mouth 2 (two) times daily. Take with meals and large glass of water.Caution:Chemotherapy    [provider]  losartan (COZAAR) 25 MG tablet Take 25 mg by mouth daily.    [provider]  ondansetron (  ZOFRAN ODT) 4 MG disintegrating tablet Take 1 tablet (4 mg total) by mouth every 8 (eight) hours as needed for nausea or vomiting. 01/14/18   Shaune Pollack, MD  pantoprazole (PROTONIX) 40 MG tablet Take 40 mg by mouth daily.    [provider]  potassium chloride (KLOR-CON) 10 MEQ tablet Take 10 mEq by mouth daily.    [provider]  predniSONE (DELTASONE) 10 MG tablet Take 2 tablets (20 mg total) by mouth daily. 06/04/22   Margarita Grizzle, MD  prochlorperazine (COMPAZINE) 10 MG tablet Take 10 mg by mouth every 6 (six) hours as needed for nausea or vomiting.     [provider]  Silodosin (RAPAFLO PO) Take by mouth.      [provider]  Tiotropium Bromide Monohydrate (SPIRIVA HANDIHALER IN) Inhale into the lungs.    [provider]      Allergies    Patient has no known allergies.    Review of Systems   Review of Systems  Constitutional:  Positive for chills. Negative for fever.  Respiratory:  Positive for shortness of breath.   Cardiovascular:  Negative for chest pain.  Gastrointestinal:  Positive for diarrhea, nausea and vomiting. Negative for abdominal pain.    Physical Exam Updated Vital Signs BP (!) 146/89   Pulse 71   Temp 98.4 F (36.9 C) (Oral) Comment: Simultaneous filing. User may not have seen previous data.  Resp 14   Ht 5\' 3"  (1.6 m)   Wt 69.9 kg   SpO2 99%   BMI 27.28 kg/m  Physical Exam Vitals and nursing note reviewed.  Constitutional:      General: He is not in acute distress.    Appearance: He is not ill-appearing.  HENT:     Head: Normocephalic.  Eyes:     Pupils: Pupils are equal, round, and reactive to light.  Cardiovascular:     Rate and Rhythm: Normal rate and regular rhythm.     Pulses: Normal pulses.     Heart sounds: Normal heart sounds. No murmur heard.    No friction rub. No gallop.  Pulmonary:     Effort: Pulmonary effort is normal.     Breath sounds: Normal breath sounds.     Comments: Respirations equal and unlabored, patient able to speak in full sentences, lungs clear to auscultation bilaterally Abdominal:     General: Abdomen is flat. There is no distension.     Palpations: Abdomen is soft.     Tenderness: There is no abdominal tenderness. There is no guarding or rebound.     Comments: Abdomen soft, nondistended, nontender to palpation in all quadrants without guarding or peritoneal signs. No rebound.   Musculoskeletal:        General: Normal range of motion.     Cervical back: Neck supple.  Skin:    General: Skin is warm and dry.  Neurological:      General: No focal deficit present.     Mental Status: He is alert.  Psychiatric:        Mood and Affect: Mood normal.        Behavior: Behavior normal.     ED Results / Procedures / Treatments   Labs (all labs ordered are listed, but only abnormal results are displayed) Labs Reviewed  CBC WITH DIFFERENTIAL/PLATELET - Abnormal; Notable for the following components:      Result Value   Platelets 123 (*)    All other components within normal limits  URINALYSIS,  ROUTINE W REFLEX MICROSCOPIC - Abnormal; Notable for the following components:   Glucose, UA 100 (*)    Hgb urine dipstick SMALL (*)    Protein, ur 30 (*)    All other components within normal limits  HEPATIC FUNCTION PANEL - Abnormal; Notable for the following components:   Total Bilirubin 1.4 (*)    Indirect Bilirubin 1.2 (*)    All other components within normal limits  URINALYSIS, MICROSCOPIC (REFLEX) - Abnormal; Notable for the following components:   Bacteria, UA RARE (*)    All other components within normal limits  I-STAT CHEM 8, ED - Abnormal; Notable for the following components:   Glucose, Bld 116 (*)    Calcium, Ion 1.14 (*)    All other components within normal limits  RESP PANEL BY RT-PCR (RSV, FLU A&B, COVID)  [id number]  LIPASE, BLOOD    EKG EKG Interpretation Date/Time:  Sunday March 05 2023 11:08:36 EST Ventricular Rate:  79 PR Interval:  195 QRS Duration:  87 QT Interval:  397 QTC Calculation: 456 R Axis:   87  Text Interpretation: Sinus rhythm Borderline right axis deviation no acute ST/T changes similar to Mar 2024 Confirmed by Pricilla Loveless [phone]) on 03/05/2023 11:32:16 AM  Radiology DG Chest Portable 1 View  Result Date: 03/05/2023 CLINICAL DATA:  Cough for 2 weeks. EXAM: PORTABLE CHEST 1 VIEW COMPARISON:  February 26, 2023. FINDINGS: The heart size and mediastinal contours are within normal limits. Both lungs are clear. The visualized skeletal structures are unremarkable. IMPRESSION: No  active disease. Electronically Signed   By: Lupita Raider M.D.   On: 03/05/2023 12:12    Procedures Procedures    Medications Ordered in ED Medications  ondansetron (ZOFRAN) injection 4 mg (4 mg Intravenous Not Given 03/05/23 1135)    ED Course/ Medical Decision Making/ A&P                                 Medical Decision Making Amount and/or Complexity of Data Reviewed Labs: ordered. Decision-making details documented in ED Course. Radiology: ordered and independent interpretation performed. Decision-making details documented in ED Course.  Risk Prescription drug management.   This patient presents to the ED for concern of cough/ N, V, D, this involves an extensive number of treatment options, and is a complaint that carries with it a high risk of complications and morbidity.  The differential diagnosis includes viral process, PNA, gastroenteritis, abx side effect, etc  77 year old male presents to the ED due to persistent cough x 2 weeks.  Started antibiotics 6 days ago and developed nausea, vomiting, and diarrhea.  Son at bedside states patient has had decreased p.o. intake and generalized weakness over the past few days.  Upon arrival patient afebrile, not tachycardic or hypoxic.  Patient in no acute distress.  Abdomen soft, nondistended, nontender.  Low suspicion for acute abdomen so will hold off on CT abdomen at this time.  Routine labs ordered to check electrolytes and renal function.  Chest x-ray to rule out evidence of pneumonia.  RVP ordered to rule out infection.  Suspect nausea, vomiting, and diarrhea likely side effect of antibiotic or part of a viral process.  Given reassuring exam low suspicion for bowel obstruction or other emergent etiologies of abdominal pain. Po fluids given due to IVF shortage.   CBC reassuring.  No leukocytosis.  Normal hemoglobin.  Thrombocytopenia at 123.  Elevated total bilirubin however, normal  LFTs. Lipase normal. Patient has no right upper  quadrant tenderness.  Low suspicion for acute cholecystitis or pancreatitis.  COVID/influenza negative.  UA significant for proteinuria however, no signs of infection. Chem 8 with normal renal function.  Chest x-ray personally reviewed and interpreted which is negative for signs of pneumonia, pneumothorax, or widened mediastinum. EKG NSR, low suspicion for atypical ACS.  Reassessed patient, patient able to tolerate po at bedside without difficulty.  Abdomen soft, nondistended, nontender.  Low suspicion for acute abdomen.  Suspect persistent cough likely related to viral etiology.  Nausea, vomiting, diarrhea could be secondary to antibiotic side effect versus viral process.  Patient discharged with Zofran as needed for nausea.  Will also discharge patient with Tessalon Perles for cough.  Advised patient to get rechecked by PCP in 2 to 3 days or return to the ED for worsening symptoms. Patient stable for discharge. Strict ED precautions discussed with patient. Patient states understanding and agrees to plan. Patient discharged home in no acute distress and stable vitals  Co morbidities that complicate the patient evaluation  HTN, tumor in stomach Cardiac Monitoring: / EKG:  The patient was maintained on a cardiac monitor.  I personally viewed and interpreted the cardiac monitored which showed an underlying rhythm of: NSR  Social Determinants of Health:  Elderly >65  Test / Admission - Considered:  CT abdomen; however abdomen nontender, low suspicion for acute abdomen.   Discussed with Dr. Criss Alvine who agrees with assessment and plan.         Final Clinical Impression(s) / ED Diagnoses Final diagnoses:  Acute cough  Nausea vomiting and diarrhea    Rx / DC Orders ED Discharge Orders          Ordered    ondansetron (ZOFRAN-ODT) 4 MG disintegrating tablet  Every 8 hours PRN        03/05/23 1317    benzonatate (TESSALON) 100 MG capsule  Every 8 hours        03/05/23 1317               Mannie Stabile, PA-C 03/05/23 1321    Pricilla Loveless, MD 03/05/23 1439

## 2023-03-05 NOTE — ED Notes (Signed)
Pt states he is unable to obtain urine sample at this time

## 2023-03-05 NOTE — ED Triage Notes (Signed)
Pt son reports cough for the last 2 weeks. Seen at [facility] last Saturday and given PO abx and cough medicine. States abx caused diarrhea, stopped PO and received IM abx from PCP. States cough worsened the last few days and pt has now lost taste, decreased appetite and NVD.

## 2023-07-15 ENCOUNTER — Emergency Department (HOSPITAL_BASED_OUTPATIENT_CLINIC_OR_DEPARTMENT_OTHER)

## 2023-07-15 ENCOUNTER — Other Ambulatory Visit: Payer: Self-pay

## 2023-07-15 ENCOUNTER — Emergency Department (HOSPITAL_BASED_OUTPATIENT_CLINIC_OR_DEPARTMENT_OTHER)
Admission: EM | Admit: 2023-07-15 | Discharge: 2023-07-15 | Disposition: A | Discharge status: Home / Self Care | Attending: Emergency Medicine | Admitting: Emergency Medicine

## 2023-07-15 ENCOUNTER — Encounter (HOSPITAL_BASED_OUTPATIENT_CLINIC_OR_DEPARTMENT_OTHER): Payer: Self-pay

## 2023-07-15 DIAGNOSIS — Z7951 Long term (current) use of inhaled steroids: Secondary | ICD-10-CM | POA: Diagnosis not present

## 2023-07-15 DIAGNOSIS — R051 Acute cough: Secondary | ICD-10-CM | POA: Insufficient documentation

## 2023-07-15 DIAGNOSIS — R519 Headache, unspecified: Secondary | ICD-10-CM | POA: Insufficient documentation

## 2023-07-15 DIAGNOSIS — Z7982 Long term (current) use of aspirin: Secondary | ICD-10-CM | POA: Insufficient documentation

## 2023-07-15 DIAGNOSIS — R1310 Dysphagia, unspecified: Secondary | ICD-10-CM | POA: Diagnosis not present

## 2023-07-15 DIAGNOSIS — I10 Essential (primary) hypertension: Secondary | ICD-10-CM | POA: Insufficient documentation

## 2023-07-15 DIAGNOSIS — J449 Chronic obstructive pulmonary disease, unspecified: Secondary | ICD-10-CM | POA: Insufficient documentation

## 2023-07-15 DIAGNOSIS — Z79899 Other long term (current) drug therapy: Secondary | ICD-10-CM | POA: Diagnosis not present

## 2023-07-15 DIAGNOSIS — Z7952 Long term (current) use of systemic steroids: Secondary | ICD-10-CM | POA: Diagnosis not present

## 2023-07-15 DIAGNOSIS — M542 Cervicalgia: Secondary | ICD-10-CM | POA: Insufficient documentation

## 2023-07-15 LAB — CBC WITH DIFFERENTIAL/PLATELET
Abs Immature Granulocytes: 0.02 10*3/uL (ref 0.00–0.07)
Basophils Absolute: 0 10*3/uL (ref 0.0–0.1)
Basophils Relative: 0 %
Eosinophils Absolute: 0.3 10*3/uL (ref 0.0–0.5)
Eosinophils Relative: 4 %
HCT: 43.4 % (ref 39.0–52.0)
Hemoglobin: 14.7 g/dL (ref 13.0–17.0)
Immature Granulocytes: 0 %
Lymphocytes Relative: 27 %
Lymphs Abs: 1.7 10*3/uL (ref 0.7–4.0)
MCH: 32.2 pg (ref 26.0–34.0)
MCHC: 33.9 g/dL (ref 30.0–36.0)
MCV: 95.2 fL (ref 80.0–100.0)
Monocytes Absolute: 0.5 10*3/uL (ref 0.1–1.0)
Monocytes Relative: 9 %
Neutro Abs: 3.6 10*3/uL (ref 1.7–7.7)
Neutrophils Relative %: 60 %
Platelets: 128 10*3/uL — ABNORMAL LOW (ref 150–400)
RBC: 4.56 MIL/uL (ref 4.22–5.81)
RDW: 12.4 % (ref 11.5–15.5)
WBC: 6.1 10*3/uL (ref 4.0–10.5)
nRBC: 0 % (ref 0.0–0.2)

## 2023-07-15 LAB — COMPREHENSIVE METABOLIC PANEL WITH GFR
ALT: 24 U/L (ref 0–44)
AST: 27 U/L (ref 15–41)
Albumin: 3.7 g/dL (ref 3.5–5.0)
Alkaline Phosphatase: 77 U/L (ref 38–126)
Anion gap: 6 (ref 5–15)
BUN: 12 mg/dL (ref 8–23)
CO2: 25 mmol/L (ref 22–32)
Calcium: 8.8 mg/dL — ABNORMAL LOW (ref 8.9–10.3)
Chloride: 106 mmol/L (ref 98–111)
Creatinine, Ser: 0.95 mg/dL (ref 0.61–1.24)
GFR, Estimated: >60 mL/min (ref >60)
Glucose, Bld: 191 mg/dL — ABNORMAL HIGH (ref 70–99)
Potassium: 3.7 mmol/L (ref 3.5–5.1)
Sodium: 137 mmol/L (ref 135–145)
Total Bilirubin: 1 mg/dL (ref 0.0–1.2)
Total Protein: 6.5 g/dL (ref 6.5–8.1)

## 2023-07-15 LAB — RESP PANEL BY RT-PCR (RSV, FLU A&B, COVID)  RVPGX2
Influenza A by PCR: NEGATIVE
Influenza B by PCR: NEGATIVE
Resp Syncytial Virus by PCR: NEGATIVE
SARS Coronavirus 2 by RT PCR: NEGATIVE

## 2023-07-15 LAB — GROUP A STREP BY PCR: Group A Strep by PCR: NOT DETECTED

## 2023-07-15 MED ORDER — IOHEXOL 350 MG/ML SOLN
75.0000 mL | Freq: Once | INTRAVENOUS | Status: AC | PRN
  Administered 2023-07-15: 75 mL via INTRAVENOUS

## 2023-07-15 MED ORDER — HYOSCYAMINE SULFATE 0.125 MG SL SUBL
0.2500 mg | SUBLINGUAL_TABLET | Freq: Once | SUBLINGUAL | Status: DC
  Filled 2023-07-15: qty 2

## 2023-07-15 MED ORDER — DEXAMETHASONE SODIUM PHOSPHATE 10 MG/ML IJ SOLN
10.0000 mg | Freq: Once | INTRAMUSCULAR | Status: AC
  Administered 2023-07-15: 10 mg via INTRAVENOUS
  Filled 2023-07-15: qty 1

## 2023-07-15 MED ORDER — ALUM & MAG HYDROXIDE-SIMETH 200-200-20 MG/5ML PO SUSP
30.0000 mL | Freq: Once | ORAL | Status: AC
  Administered 2023-07-15: 30 mL via ORAL
  Filled 2023-07-15: qty 30

## 2023-07-15 NOTE — Discharge Instructions (Addendum)
Return if symptoms worsen. Follow up with your primary care doctor.

## 2023-07-15 NOTE — ED Provider Notes (Signed)
 [facility] Provider Note   CSN: [id number] Arrival date & time: 07/15/23  9528     History  Chief Complaint  Patient presents with   Headache   Dysphagia    Theodore Hess is a 78 y.o. male.  Patient here with right-sided headache for the last week also some right-sided neck pain but main reason for visit today is some difficulty swallowing.  He feels like it is difficult to swallow up in his upper throat mostly in the left side.  He does not really endorse that anything is stuck.  Has been able to drink liquids but when he tried to eat some rice this morning felt like it was a hard thing to do.  Felt like it was getting stuck high up in his throat may be and was having a hard time initiating swallowing.  Does not describe any food getting stuck in the middle of his chest or lower throat.  He has had this ongoing right-sided headache that is painful to touch.  He has a history of GIST tumor.  He supposed to have scans done soon.  Started to notice this around 1 AM.  He would not really describe it as a cough.  He has a history of COPD.  History of hypertension.  Denies any chest pain shortness of breath.  Denies any weakness numbness tingling.  No vision loss no speech changes.  He does not really describe any specific pain in the throat or pain with swallowing.  He just feels the constant need to clear his throat because something does not feel well on the left side of his throat.  Has not noticed any lip swelling tongue swelling facial swelling.  Language interpreter was used with Mandarin.  Although he speaks some other dialect.  Family at some point did not want to use the interpreter anymore because it was more difficult for them.  So therefore son prefer that I just used him to do translation.  Attempt was made to use interpreter which was present throughout.  The history is provided by the patient. A language interpreter was used.        Home Medications Prior to Admission medications   Medication Sig Start Date End Date Taking? Authorizing Provider  albuterol (VENTOLIN HFA) 108 (90 Base) MCG/ACT inhaler Inhale 1-2 puffs into the lungs every 6 (six) hours as needed for wheezing or shortness of breath. 12/12/21   Countryman, Chase, MD  Albuterol Sulfate (PROAIR HFA IN) Inhale 8.5 mg into the lungs.    [provider]  ammonium lactate (LAC-HYDRIN) 12 % lotion Apply 1 Application topically as needed for dry skin.    [provider]  aspirin 81 MG tablet Take 81 mg by mouth daily.      [provider]  atorvastatin (LIPITOR) 20 MG tablet Take 20 mg by mouth daily.    [provider]  benzonatate (TESSALON) 100 MG capsule Take 1 capsule (100 mg total) by mouth every 8 (eight) hours. 12/12/21   Onetha Bile, MD  benzonatate (TESSALON) 100 MG capsule Take 1 capsule (100 mg total) by mouth every 8 (eight) hours. 03/05/23   Aberman, Caroline C, PA-C  diclofenac sodium (VOLTAREN) 1 % GEL Apply topically 4 (four) times daily.    [provider]  dutasteride (AVODART) 0.5 MG capsule Take 0.5 mg by mouth daily.      [provider]  fluticasone (FLONASE) 50 MCG/ACT nasal spray Place  2 sprays into the nose daily.      [provider]  Fluticasone-Umeclidin-Vilant (TRELEGY ELLIPTA) 100-62.5-25 MCG/ACT AEPB Inhale into the lungs.    [provider]  hydrochlorothiazide (HYDRODIURIL) 25 MG tablet Take 25 mg by mouth daily.    [provider]  imatinib (GLEEVEC) 100 MG tablet Take 100 mg by mouth 2 (two) times daily. Take with meals and large glass of water.Caution:Chemotherapy    [provider]  losartan (COZAAR) 25 MG tablet Take 25 mg by mouth daily.    [provider]  ondansetron (ZOFRAN ODT) 4 MG disintegrating tablet Take 1 tablet (4 mg total) by mouth every 8 (eight) hours as needed for nausea or vomiting. 01/14/18   Loman Risk, MD   ondansetron (ZOFRAN-ODT) 4 MG disintegrating tablet Take 1 tablet (4 mg total) by mouth every 8 (eight) hours as needed for nausea or vomiting. 03/05/23   Lear Prosper, PA-C  pantoprazole (PROTONIX) 40 MG tablet Take 40 mg by mouth daily.    [provider]  potassium chloride (KLOR-CON) 10 MEQ tablet Take 10 mEq by mouth daily.    [provider]  predniSONE (DELTASONE) 10 MG tablet Take 2 tablets (20 mg total) by mouth daily. 06/04/22   Auston Blush, MD  prochlorperazine (COMPAZINE) 10 MG tablet Take 10 mg by mouth every 6 (six) hours as needed for nausea or vomiting.    [provider]  Silodosin (RAPAFLO PO) Take by mouth.      [provider]  Tiotropium Bromide Monohydrate (SPIRIVA HANDIHALER IN) Inhale into the lungs.    [provider]      Allergies    Patient has no known allergies.    Review of Systems   Review of Systems  Physical Exam Updated Vital Signs BP (!) 157/92   Pulse 83   Temp 98.2 F (36.8 C)   Resp 18   Ht 5\' 3"  (1.6 m)   SpO2 96%   BMI 27.28 kg/m  Physical Exam Vitals and nursing note reviewed.  Constitutional:      General: He is not in acute distress.    Appearance: He is well-developed. He is not ill-appearing.  HENT:     Head: Normocephalic and atraumatic.     Comments: No obvious swelling of the lips tongue or posterior oropharynx, no foreign body seen    Mouth/Throat:     Mouth: Mucous membranes are moist.  Eyes:     General: No visual field deficit.    Extraocular Movements: Extraocular movements intact.     Conjunctiva/sclera: Conjunctivae normal.     Pupils: Pupils are equal, round, and reactive to light.  Cardiovascular:     Rate and Rhythm: Normal rate and regular rhythm.     Heart sounds: No murmur heard. Pulmonary:     Effort: Pulmonary effort is normal. No respiratory distress.     Breath sounds: Normal breath sounds.  Abdominal:     Palpations: Abdomen is soft.     Tenderness:  There is no abdominal tenderness.  Musculoskeletal:        General: No swelling.     Cervical back: Normal range of motion and neck supple. No rigidity.  Skin:    General: Skin is warm and dry.     Capillary Refill: Capillary refill takes less than 2 seconds.  Neurological:     Mental Status: He is alert and oriented to person, place, and time.     Cranial Nerves: No cranial  nerve deficit, dysarthria or facial asymmetry.     Sensory: No sensory deficit.     Motor: No weakness.     Coordination: Romberg sign negative. Coordination normal.     Comments: Normal speech, normal finger-to-nose finger, normal visual visual fields, 5+ out of 5 strength throughout, normal sensation pupils equal and reactive  Psychiatric:        Mood and Affect: Mood normal.     ED Results / Procedures / Treatments   Labs (all labs ordered are listed, but only abnormal results are displayed) Labs Reviewed  CBC WITH DIFFERENTIAL/PLATELET - Abnormal; Notable for the following components:      Result Value   Platelets 128 (*)    All other components within normal limits  COMPREHENSIVE METABOLIC PANEL WITH GFR - Abnormal; Notable for the following components:   Glucose, Bld 191 (*)    Calcium 8.8 (*)    All other components within normal limits  GROUP A STREP BY PCR  RESP PANEL BY RT-PCR (RSV, FLU A&B, COVID)  [id number]    EKG EKG Interpretation Date/Time:  Saturday July 15 2023 10:05:46 EDT Ventricular Rate:  74 PR Interval:  221 QRS Duration:  87 QT Interval:  384 QTC Calculation: 426 R Axis:   82  Text Interpretation: Sinus rhythm Prolonged PR interval Confirmed by Lowery Rue [id number]) on 07/15/2023 10:09:31 AM  Radiology CT Soft Tissue Neck W Contrast Result Date: 07/15/2023 CLINICAL DATA:  Epiglottitis or tonsillitis suspected EXAM: CT NECK WITH CONTRAST TECHNIQUE: Multidetector CT imaging of the neck was performed using the standard protocol following the bolus administration of intravenous  contrast. RADIATION DOSE REDUCTION: This exam was performed according to the departmental dose-optimization program which includes automated exposure control, adjustment of the mA and/or kV according to patient size and/or use of iterative reconstruction technique. CONTRAST:  75mL OMNIPAQUE IOHEXOL 350 MG/ML SOLN COMPARISON:  None Available. FINDINGS: Pharynx and larynx: Normal. No mass or swelling. Salivary glands: No inflammation, mass, or stone. Thyroid: Normal. Lymph nodes: None enlarged or abnormal density. Vascular: Negative. Limited intracranial: Negative. Visualized orbits: Negative. Mastoids and visualized paranasal sinuses: Generalized opacification of sphenoid and ethmoid sinuses with polyps and patchy high-density, unchanged from 2022. Partial bilateral mastoid opacification with normal nasopharynx. Skeleton: No acute or aggressive finding Upper chest: Biapical emphysema IMPRESSION: No acute finding.  No explanation for symptoms. Electronically Signed   By: Ronnette Coke M.D.   On: 07/15/2023 12:28   CT ANGIO HEAD NECK W WO CM Result Date: 07/15/2023 CLINICAL DATA:  Neuro deficit with stroke suspected. Posterior right-sided headache with pain to palpation radiating to the lateral right neck and shoulder with movement. Trouble swallowing with globus sensation. EXAM: CT ANGIOGRAPHY HEAD AND NECK WITH AND WITHOUT CONTRAST TECHNIQUE: Multidetector CT imaging of the head and neck was performed using the standard protocol during bolus administration of intravenous contrast. Multiplanar CT image reconstructions and MIPs were obtained to evaluate the vascular anatomy. Carotid stenosis measurements (when applicable) are obtained utilizing NASCET criteria, using the distal internal carotid diameter as the denominator. RADIATION DOSE REDUCTION: This exam was performed according to the departmental dose-optimization program which includes automated exposure control, adjustment of the mA and/or kV according to  patient size and/or use of iterative reconstruction technique. CONTRAST:  75mL OMNIPAQUE IOHEXOL 350 MG/ML SOLN COMPARISON:  11/26/2009 head CT and 01/01/2021 sinus CT. FINDINGS: CT HEAD FINDINGS Brain: No evidence of acute infarction, hemorrhage, hydrocephalus, extra-axial collection or mass lesion/mass effect. Vascular: No hyperdense vessel or unexpected calcification.  Skull: Normal. Negative for fracture or focal lesion. Sinuses/Orbits: Generalized opacification of paranasal sinuses, confluent at the frontal, ethmoid, and sphenoid sinuses with bilateral nasal polyps. No significant change since 2022. Review of the MIP images confirms the above findings CTA NECK FINDINGS Aortic arch: Atheromatous plaque with 3 vessel branching Right carotid system: Mild atheromatous wall thickening of the common carotid. No stenosis, ulceration, or beading Left carotid system: Mild atheromatous wall thickening of the common carotid with accentuated calcified plaque on the posterior wall of the proximal ICA. No stenosis, ulceration, or beading. Vertebral arteries: The left vertebral artery is dominant. No proximal subclavian stenosis. No beading or vertebral narrowing. Skeleton: Unremarkable Other neck: Small left thyroid nodule not meeting size for shoulder for sonographic follow-up based on 9 mm size Upper chest: Centrilobular emphysema that is mild. Review of the MIP images confirms the above findings CTA HEAD FINDINGS Anterior circulation: Atheromatous calcification along the bilateral carotid siphon. No branch occlusion, beading, or aneurysm. High-grade narrowing at the paraclinoid right ICA, measurement confounded by calcified plaque blooming but over 50% stenosis. Posterior circulation: The left vertebral artery is dominant. Mild atheromatous calcification of the V4 segments. No branch occlusion, beading, or aneurysm. No proximal flow reducing stenosis Venous sinuses: Unremarkable Anatomic variants: As above Review of the  MIP images confirms the above findings IMPRESSION: No emergent finding. Atherosclerosis in the head and neck, most notably affecting the right ICA where there is at least 50% supraclinoid stenosis. 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 Electronically Signed   By: Ronnette Coke M.D.   On: 07/15/2023 12:23   DG Chest Portable 1 View Result Date: 07/15/2023 CLINICAL DATA:  Cough. EXAM: PORTABLE CHEST 1 VIEW COMPARISON:  03/05/2023 FINDINGS: The lungs are clear without focal pneumonia, edema, pneumothorax or pleural effusion. The cardiopericardial silhouette is within normal limits for size. No acute bony abnormality. Telemetry leads overlie the chest. IMPRESSION: No active disease. Electronically Signed   By: Donnal Fusi M.D.   On: 07/15/2023 10:17    Procedures Procedures    Medications Ordered in ED Medications  dexamethasone (DECADRON) injection 10 mg (has no administration in time range)  alum & mag hydroxide-simeth (MAALOX/MYLANTA) 200-200-20 MG/5ML suspension 30 mL (30 mLs Oral Given 07/15/23 1004)  iohexol (OMNIPAQUE) 350 MG/ML injection 75 mL (75 mLs Intravenous Contrast Given 07/15/23 1140)    ED Course/ Medical Decision Making/ A&P                                 Medical Decision Making Amount and/or Complexity of Data Reviewed Labs: ordered. Radiology: ordered.  Risk OTC drugs. Prescription drug management.   Cathy Cobbs is here with headache, trouble swallowing.  Normal vitals.  No fever.  History of hypertension heart disease, GIST tumor, COPD.  Around 1 AM he started having some difficulty with swallowing and feels like the left side of his upper throat having some discomfort difficulty swallowing.  He ate vegetables and fish with bones and then yesterday.  This is a flaky fish.  This is pretty early in the evening yesterday.  Does not feel like there is any foreign object in his throat.  He describes being able to drink liquids without any issues but when he  tried to eat some rice this morning felt like it was hard to initiate swallowing.  He does not describe a feeling of something being stuck in the middle of his chest or  lower throat.  Denies any weakness numbness tingling.  Denies any speech changes vision changes.  He is had also ongoing right-sided headache for the last week.  He denies any fevers or chills.  Differential diagnosis is wide this could be acid reflux, throat/viral process, seems less likely to be foreign body, food impaction given history and physical.  His neurological exam is normal other than this subjective difficulty swallowing.  Stroke also in the differential at this point.  He has had right-sided headache for the last week but states that it is pretty tender to touch to the right side of his scalp at times.  In the room he is mostly trying to clear his throat.  He does not describe any pain there does not appear to be any infectious process.  He has been able to tolerate liquids.  Ultimately will get a CTA of his head and neck soft tissue of his neck CT chest x-ray and check basic labs will give GI cocktail see if he can tolerate p.o. here.  Could be inflammatory process.    Overall lab work is unremarkable.  No significant leukocytosis anemia electrolyte abnormality kidney injury.  CT images are unremarkable per radiology report.  Strep test negative.  COVID flu RSV test negative.  On reevaluation he is not having any more discomfort with swallowing or difficulty with swallowing.  GI cocktail has helped greatly he states.  Overall he was able to tolerate fluids and crackers without any difficulties.  Sounds like he does struggle with some chronic cough, chronic clearing of his throat, chronic reflux.  Ultimately I think that is likely the cause of today's symptoms.  As far as his headache I think that is migraine related.  Will give him a dose of Decadron to help with that.  Continue Tylenol.  I have no concern for other emergent process  at this time.  We talked about how this is likely not stroke related given his improvements and that this is likely reflux.  Overall shared decision was made on holding off on any further workup including MRI for this.  Instead they will continue management at home return if symptoms worsen.  Follow-up with your primary care doctor.  Patient discharged.  I also have no concern for food impaction given his ability to swallow solids and liquids.  This chart was dictated using voice recognition software.  Despite best efforts to proofread,  errors can occur which can change the documentation meaning.         Final Clinical Impression(s) / ED Diagnoses Final diagnoses:  Bad headache  Acute cough  Dysphagia, unspecified type    Rx / DC Orders ED Discharge Orders     None         Lowery Rue, DO 07/15/23 1250

## 2023-07-15 NOTE — ED Triage Notes (Signed)
 For the past week has had posterior right sided headache, states pain upon palpation. Pain also radiating to right lateral neck/shoulder with movement. This morning states having trouble swallowing, feels like something is stuck. Continues to clear throat in triage.

## 2023-07-19 ENCOUNTER — Emergency Department (HOSPITAL_BASED_OUTPATIENT_CLINIC_OR_DEPARTMENT_OTHER)
Admission: EM | Admit: 2023-07-19 | Discharge: 2023-07-19 | Disposition: A | Discharge status: Home / Self Care | Attending: Emergency Medicine | Admitting: Emergency Medicine

## 2023-07-19 ENCOUNTER — Other Ambulatory Visit: Payer: Self-pay

## 2023-07-19 ENCOUNTER — Encounter (HOSPITAL_BASED_OUTPATIENT_CLINIC_OR_DEPARTMENT_OTHER): Payer: Self-pay | Admitting: Emergency Medicine

## 2023-07-19 DIAGNOSIS — J449 Chronic obstructive pulmonary disease, unspecified: Secondary | ICD-10-CM | POA: Diagnosis not present

## 2023-07-19 DIAGNOSIS — Z7982 Long term (current) use of aspirin: Secondary | ICD-10-CM | POA: Diagnosis not present

## 2023-07-19 DIAGNOSIS — J029 Acute pharyngitis, unspecified: Secondary | ICD-10-CM | POA: Diagnosis present

## 2023-07-19 DIAGNOSIS — Z79899 Other long term (current) drug therapy: Secondary | ICD-10-CM | POA: Insufficient documentation

## 2023-07-19 DIAGNOSIS — I10 Essential (primary) hypertension: Secondary | ICD-10-CM | POA: Diagnosis not present

## 2023-07-19 DIAGNOSIS — Z7951 Long term (current) use of inhaled steroids: Secondary | ICD-10-CM | POA: Diagnosis not present

## 2023-07-19 DIAGNOSIS — H6691 Otitis media, unspecified, right ear: Secondary | ICD-10-CM | POA: Diagnosis not present

## 2023-07-19 LAB — RESP PANEL BY RT-PCR (RSV, FLU A&B, COVID)  RVPGX2
Influenza A by PCR: NEGATIVE
Influenza B by PCR: NEGATIVE
Resp Syncytial Virus by PCR: NEGATIVE
SARS Coronavirus 2 by RT PCR: NEGATIVE

## 2023-07-19 LAB — GROUP A STREP BY PCR: Group A Strep by PCR: NOT DETECTED

## 2023-07-19 MED ORDER — AMOXICILLIN 500 MG PO CAPS
1000.0000 mg | ORAL_CAPSULE | Freq: Two times a day (BID) | ORAL | 0 refills | Status: AC
Start: 2023-07-20 — End: 2023-07-25

## 2023-07-19 MED ORDER — AMOXICILLIN 500 MG PO CAPS
1000.0000 mg | ORAL_CAPSULE | Freq: Once | ORAL | Status: AC
  Administered 2023-07-19: 1000 mg via ORAL
  Filled 2023-07-19: qty 2

## 2023-07-19 MED ORDER — AMOXICILLIN 500 MG PO CAPS: 1000.0000 mg | ORAL_CAPSULE | Freq: Two times a day (BID) | ORAL | 0 refills | Status: DC

## 2023-07-19 NOTE — ED Notes (Signed)
 Pt alert and oriented X 4 at the time of discharge. RR even and unlabored. No acute distress noted. Pt verbalized understanding of discharge instructions as discussed. Pt ambulatory to lobby at time of discharge.

## 2023-07-19 NOTE — ED Provider Notes (Signed)
 [place] [facility] Provider Note   CSN: [id number] Arrival date & time: 07/19/23  1425     History  Chief Complaint  Patient presents with   Sore Throat    Theodore Hess is a 78 y.o. 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  He denies any drainage from the ear or any injury to the ear.  He also reports a globus sensation in his throat, but able to swallow and tolerate secretions.  Denies any fever or chills.  Denies any shortness of breath or change in voice sound.  States he was told to take naproxen  from his PCP, but this has not helped with his symptoms.   Sore Throat       Home Medications Prior to Admission medications   Medication Sig Start Date End Date Taking? Authorizing Provider  albuterol  (VENTOLIN  HFA) 108 (90 Base) MCG/ACT inhaler Inhale 1-2 puffs into the lungs every 6 (six) hours as needed for wheezing or shortness of breath. 12/12/21   Onetha Bile, MD  Albuterol  Sulfate (PROAIR  HFA IN) Inhale 8.5 mg into the lungs.    [provider]  ammonium lactate (LAC-HYDRIN) 12 % lotion Apply 1 Application topically as needed for dry skin.    [provider]  amoxicillin  (AMOXIL ) 500 MG capsule Take 2 capsules (1,000 mg total) by mouth 2 (two) times daily for 5 days. 07/20/23 07/25/23  Rexie Catena, PA-C  aspirin 81 MG tablet Take 81 mg by mouth daily.      [provider]  atorvastatin (LIPITOR) 20 MG tablet Take 20 mg by mouth daily.    [provider]  benzonatate  (TESSALON ) 100 MG capsule Take 1 capsule (100 mg total) by mouth every 8 (eight) hours. 12/12/21   Onetha Bile, MD  benzonatate  (TESSALON ) 100 MG capsule Take 1 capsule (100 mg total) by mouth every 8 (eight) hours. 03/05/23   Aberman, Caroline C, PA-C  diclofenac sodium  (VOLTAREN) 1 % GEL Apply topically 4 (four) times daily.    [provider]  dutasteride (AVODART) 0.5 MG capsule Take 0.5 mg by mouth daily.      [provider]  fluticasone (FLONASE) 50 MCG/ACT nasal spray Place 2 sprays into the nose daily.      [provider]  Fluticasone-Umeclidin-Vilant (TRELEGY ELLIPTA) 100-62.5-25 MCG/ACT AEPB Inhale into the lungs.    [provider]  hydrochlorothiazide (HYDRODIURIL) 25 MG tablet Take 25 mg by mouth daily.    [provider]  imatinib (GLEEVEC) 100 MG tablet Take 100 mg by mouth 2 (two) times daily. Take with meals and large glass of water.Caution:Chemotherapy    [provider]  losartan (COZAAR) 25 MG tablet Take 25 mg by mouth daily.    [provider]  ondansetron  (ZOFRAN  ODT) 4 MG disintegrating tablet Take 1 tablet (4 mg total) by mouth every 8 (eight) hours as needed for nausea or vomiting. 01/14/18   Loman Risk, MD  ondansetron  (ZOFRAN -ODT) 4 MG disintegrating tablet Take 1 tablet (4 mg total) by mouth every 8 (eight) hours as needed for nausea or vomiting. 03/05/23   Lear Prosper, PA-C  pantoprazole (PROTONIX) 40 MG tablet Take 40 mg by mouth daily.    [provider]  potassium chloride (KLOR-CON) 10 MEQ tablet Take 10 mEq by  mouth daily.    [provider]  predniSONE  (DELTASONE ) 10 MG tablet Take 2 tablets (20 mg total) by mouth daily. 06/04/22   Auston Blush, MD  prochlorperazine (COMPAZINE) 10 MG tablet Take 10 mg by mouth every 6 (six) hours as needed for nausea or vomiting.    [provider]  Silodosin (RAPAFLO PO) Take by mouth.      [provider]  Tiotropium Bromide Monohydrate (SPIRIVA HANDIHALER IN) Inhale into the lungs.    [provider]      Allergies    Patient has no known allergies.    Review of Systems   Review of Systems  Constitutional:  Negative for fever.    Physical Exam Updated Vital Signs BP  (!) 159/94 (BP Location: Right Arm)   Pulse 65   Temp 98.8 F (37.1 C) (Oral)   Resp 20   Wt 72.6 kg   SpO2 100%   BMI 28.34 kg/m  Physical Exam Vitals and nursing note reviewed.  Constitutional:      General: He is not in acute distress.    Appearance: He is well-developed.     Comments: Well-appearing, talking without difficulty.  HENT:     Head: Normocephalic and atraumatic.     Ears:     Comments: Tympanic membranes bilaterally appear bulging.  Left TM nonerythematous.  Right TM with mild area of erythema.  No drainage from the ears bilaterally.  No mastoid tenderness bilaterally    Mouth/Throat:     Comments: Posterior oropharynx without any erythema or edema.  No tonsillar exudate.  Swallowing secretions without difficulty. Eyes:     Conjunctiva/sclera: Conjunctivae normal.  Cardiovascular:     Rate and Rhythm: Normal rate and regular rhythm.     Heart sounds: No murmur heard. Pulmonary:     Effort: Pulmonary effort is normal. No respiratory distress.     Breath sounds: Normal breath sounds.     Comments: Talking in full sentences on room air without difficulty No stridor Abdominal:     Palpations: Abdomen is soft.     Tenderness: There is no abdominal tenderness.  Musculoskeletal:        General: No swelling.     Cervical back: Neck supple.  Lymphadenopathy:     Cervical: No cervical adenopathy.  Skin:    General: Skin is warm and dry.     Capillary Refill: Capillary refill takes less than 2 seconds.  Neurological:     Mental Status: He is alert.  Psychiatric:        Mood and Affect: Mood normal.     ED Results / Procedures / Treatments   Labs (all labs ordered are listed, but only abnormal results are displayed) Labs Reviewed  RESP PANEL BY RT-PCR (RSV, FLU A&B, COVID)  [id number]  GROUP A STREP BY PCR    EKG None  Radiology No results found.  Procedures Procedures    Medications Ordered in ED Medications  amoxicillin  (AMOXIL ) capsule 1,000  mg (1,000 mg Oral Given 07/19/23 1920)    ED Course/ Medical Decision Making/ A&P                                 Medical Decision Making    Differential diagnosis includes but is not limited to COVID, flu, RSV, viral URI, strep pharyngitis, viral pharyngitis, allergic rhinitis, pneumonia, bronchitis, esophageal stricture, malignancy, mastoiditis   ED Course:  Upon initial  evaluation, patient is well-appearing, stable vital signs aside from elevated blood pressure 159/94.  Reporting right ear pain.  On exam the right TM is bulging with mild erythema.  Left TM bulging, but with no erythema.  No mastoid tenderness.  Suspect patient has otitis media, will treat with amoxicillin .  He is also reporting throat pain, posterior oropharynx without any erythema, edema, no tonsillar exudate.  Patient swallowing without difficulty.  Labs Ordered: I Ordered, and personally interpreted labs.  The pertinent results include:   Strep, flu, COVID, RSV negative   Record Review: External records from outside source obtained and reviewed including  ER note from 07/15/2023 where patient was seen for the same complaint of sore throat and right sided headache/ear pain.  He had a CT neck and CTA head which did not have any findings to explain his symptoms.  His CBC at that time did not have any leukocytosis.  CMP unremarkable.  His flu, COVID, RSV, and strep at that visit was also negative.  Medications Given: 1000 mg amoxicillin   Given patient had unremarkable labs and imaging from 07/15/2023, do not feel we need to repeat any further imaging at this time as he is able to swallow without difficulty, posterior oropharynx without abnormality, no change to his voice, stable vitals. Low concern for any emergent pathology at this time. His right ear pain is likely due to otitis media.  I reviewed patient with my attending Dr. Martina Sledge, she agrees with plan of care.  She recommends having patient follow-up with ENT.  I  discussed this with the patient and they are in agreement with the plan.    Impression: Right ear pain likely due to otitis media Pharyngitis  Disposition:  The patient was discharged home with instructions to follow-up with ENT as soon as possible, their contact information was provided.  They understand they need to call to schedule an appointment, or have their PCP send a referral.  May take 500 mg naproxen  up to every 12 hours as needed for pain.  May take Tylenol  as needed for pain. Take course of amoxicillin  as prescribed. Return precautions given.       This chart was dictated using voice recognition software, Dragon. Despite the best efforts of this provider to proofread and correct errors, errors may still occur which can change documentation meaning.          Final Clinical Impression(s) / ED Diagnoses Final diagnoses:  Pharyngitis, unspecified etiology  Right otitis media, unspecified otitis media type    Rx / DC Orders ED Discharge Orders          Ordered    amoxicillin  (AMOXIL ) 500 MG capsule  2 times daily,   Status:  Discontinued        07/19/23 1911    amoxicillin  (AMOXIL ) 500 MG capsule  2 times daily        07/19/23 1932              Rexie Catena, PA-C 07/19/23 1932    Quinn Bucco, DO 07/23/23 1427

## 2023-07-19 NOTE — Discharge Instructions (Addendum)
?????? (  COVID-19)??????????? (RSV) ?????????????  ??????????????????????????????????????????????????????????????????5?????????  ????????6??????1000????????????????????4??  ????????12??????500?????? (Aleve) ??????  ????????????????????????????????????????????????????????  ???????????????????????????????????   Your COVID, flu, RSV, and strep test is negative today.  Your ear looks like it may be infected.  You have been prescribed an antibiotic called amoxicillin.  You were given your first dose here today.  Start taking the rest of the antibiotic tomorrow morning.  This is twice daily for the next 5 days.  You may take up to 1000mg  of tylenol every 6 hours as needed for pain.  Do not take more then 4g per day.  You may take up to 500mg  naproxen (Aleve) every 12 hours as needed for pain.   Please follow-up with the ENT doctor listed below as soon as possible for further evaluation.  You will have to call and schedule an appointment.  You may also have your primary doctor send a referral for you.  Return to the ER if you are unable to swallow, develop fevers, if you have any other new or concerning symptoms.

## 2023-07-19 NOTE — ED Triage Notes (Signed)
 Persistent sore throat and unable to eat due to pain . Was seen 2 days ago for same . Pain is worse to right head and ear .
